# Patient Record
Sex: Male | Born: 1960 | Race: Black or African American | Hispanic: No | Smoking: Current every day smoker
Health system: Southern US, Community
[De-identification: ages and names within clinical notes are randomized; demographics above are authoritative.]

## PROBLEM LIST (undated history)

## (undated) DIAGNOSIS — E119 Type 2 diabetes mellitus without complications: Secondary | ICD-10-CM

## (undated) DIAGNOSIS — F1010 Alcohol abuse, uncomplicated: Secondary | ICD-10-CM

## (undated) HISTORY — PX: OTHER SURGICAL HISTORY: SHX169

---

## 2017-10-27 ENCOUNTER — Encounter: Payer: Self-pay | Admitting: Pediatric Intensive Care

## 2017-11-03 ENCOUNTER — Encounter: Payer: Self-pay | Admitting: Pediatric Intensive Care

## 2017-11-03 DIAGNOSIS — E118 Type 2 diabetes mellitus with unspecified complications: Secondary | ICD-10-CM

## 2017-11-03 LAB — GLUCOSE, POCT (MANUAL RESULT ENTRY): POC Glucose: 230 mg/dl — AB (ref 70–99)

## 2017-11-25 ENCOUNTER — Encounter (HOSPITAL_COMMUNITY): Payer: Self-pay | Admitting: Emergency Medicine

## 2017-11-25 ENCOUNTER — Other Ambulatory Visit: Payer: Self-pay

## 2017-11-25 ENCOUNTER — Emergency Department (HOSPITAL_COMMUNITY)
Admission: EM | Admit: 2017-11-25 | Discharge: 2017-11-26 | Disposition: A | Discharge status: Home / Self Care | Payer: Medicaid Other | Attending: Emergency Medicine | Admitting: Emergency Medicine

## 2017-11-25 DIAGNOSIS — R4182 Altered mental status, unspecified: Secondary | ICD-10-CM | POA: Diagnosis present

## 2017-11-25 DIAGNOSIS — F10920 Alcohol use, unspecified with intoxication, uncomplicated: Secondary | ICD-10-CM

## 2017-11-25 DIAGNOSIS — F10120 Alcohol abuse with intoxication, uncomplicated: Secondary | ICD-10-CM | POA: Insufficient documentation

## 2017-11-25 NOTE — ED Provider Notes (Signed)
[facility]-EMERGENCY DEPT Provider Note   CSN: [id number] Arrival date & time: 11/25/17  2236     History   Chief Complaint Chief Complaint  Patient presents with  . Alcohol Intoxication    HPI Michael [name] is a 57 y.o. male.  LEVEL 5 CAVEAT 2/2 AMS: INTOXICATED  57 year old male presents to the emergency department by EMS after they were called by a bystander who found the patient slumped on the street.  Patient obviously intoxicated.  He was combative on scene with EMS requiring 2.5 mg IV Versed.  Patient allegedly tried to jump out of the EMS vehicle and was given a subsequent dose of 2.5 mg Versed.  He arrived to the emergency department asleep.  No obvious injuries noted.    Alcohol Intoxication     History reviewed. No pertinent past medical history.  There are no active problems to display for this patient.   History reviewed. No pertinent surgical history.     Home Medications    Prior to Admission medications   Not on File    Family History History reviewed. No pertinent family history.  Social History Social History   Tobacco Use  . Smoking status: Not on file  Substance Use Topics  . Alcohol use: Yes  . Drug use: Not on file     Allergies   Patient has no allergy information on record.   Review of Systems Review of Systems  Unable to perform ROS: Mental status change  Patient intoxicated   Physical Exam Updated Vital Signs BP 132/73   Pulse 97   Temp 97.8 F (36.6 C) (Axillary)   Resp 20   SpO2 95%   Physical Exam  Constitutional: He appears well-developed and well-nourished. No distress.  Sleeping, snoring  HENT:  Head: Normocephalic and atraumatic.  Eyes: Conjunctivae and EOM are normal. No scleral icterus.  Neck: Normal range of motion.  Cardiovascular: Normal rate, regular rhythm and intact distal pulses.  Pulmonary/Chest: Effort normal. No stridor. No respiratory distress. He has no wheezes. He  has no rales.  Lungs CTAB  Musculoskeletal: Normal range of motion.  Neurological: He exhibits normal muscle tone.  Skin: Skin is warm and dry. No rash noted. He is not diaphoretic. No erythema. No pallor.  Psychiatric: He has a normal mood and affect. His behavior is normal.  Nursing note and vitals reviewed.    ED Treatments / Results  Labs (all labs ordered are listed, but only abnormal results are displayed) Labs Reviewed - No data to display  EKG  EKG Interpretation None       Radiology No results found.  Procedures Procedures (including critical care time)  Medications Ordered in ED Medications - No data to display    Initial Impression / Assessment and Plan / ED Course  I have reviewed the triage vital signs and the nursing notes.  Pertinent labs & imaging results that were available during my care of the patient were reviewed by me and considered in my medical decision making (see chart for details).     11:10 PM Patient presenting to the emergency department by EMS after being found sleeping on the street by bystanders, intoxicated.  Allegedly agitated with EMS requiring Versed 2.5mg  x 2 for sedation.  Arrives to the ED sleeping, no signs of injury.  2:25 AM Patient up, alert, eating peanuts. Speech mildly slurred. States he came from a shelter.  Reports hx of alcoholism.  Still with evidence of clinical intoxication.  Will allow for further sobering.  2:56 AM Ambulatory independently with steady gait. Wanting to smoke. Directed back to room.   5:49 AM The patient has been allowed to sober further. VSS. He is stable for discharge at this time.   Vitals:   11/26/17 0100 11/26/17 0130 11/26/17 0536 11/26/17 0537  BP: 131/79 118/70  110/70  Pulse: 91 90 88 99  Resp:   18 18  Temp:      TempSrc:      SpO2: 96% 97%  97%    Final Clinical Impressions(s) / ED Diagnoses   Final diagnoses:  Alcoholic intoxication without complication [facility])    ED  Discharge Orders    None       Antony Madura, PA-C 11/26/17 0550    Gerhard Munch, MD 11/26/17 [phone]

## 2017-11-25 NOTE — ED Triage Notes (Signed)
[place] for [place].  A bystander found patient slumped over and called out EMS.   Pt appeared heavily etoh intoxicated on scene, and was also very combative---- was given Versed 2.5 mg IV.  Pt attempted to jumped out of EMS' vehicle and so was given another dose of Versed 2.5 mg IV before pt successfully pulled IV out.  Pt arrived to ED asleep.

## 2017-11-25 NOTE — ED Notes (Signed)
Bed: ZO10WA12 Expected date:  Expected time:  Means of arrival:  Comments: [age] m etoh/agitated

## 2017-11-26 ENCOUNTER — Emergency Department (HOSPITAL_COMMUNITY): Payer: Medicaid Other

## 2017-11-26 ENCOUNTER — Encounter (HOSPITAL_COMMUNITY): Payer: Self-pay

## 2017-11-26 ENCOUNTER — Emergency Department (HOSPITAL_COMMUNITY)
Admission: EM | Admit: 2017-11-26 | Discharge: 2017-11-26 | Disposition: A | Discharge status: Home / Self Care | Payer: Medicaid Other | Source: Home / Self Care | Attending: Emergency Medicine | Admitting: Emergency Medicine

## 2017-11-26 ENCOUNTER — Other Ambulatory Visit: Payer: Self-pay

## 2017-11-26 DIAGNOSIS — F10920 Alcohol use, unspecified with intoxication, uncomplicated: Secondary | ICD-10-CM

## 2017-11-26 LAB — BASIC METABOLIC PANEL
ANION GAP: 14 (ref 5–15)
BUN: 17 mg/dL (ref 6–20)
CHLORIDE: 111 mmol/L (ref 101–111)
CO2: 23 mmol/L (ref 22–32)
Calcium: 9.5 mg/dL (ref 8.9–10.3)
Creatinine, Ser: 1.07 mg/dL (ref 0.61–1.24)
GFR calc Af Amer: >60 mL/min (ref >60)
GLUCOSE: 183 mg/dL — AB (ref 65–99)
Potassium: 4 mmol/L (ref 3.5–5.1)
Sodium: 148 mmol/L — ABNORMAL HIGH (ref 135–145)

## 2017-11-26 LAB — CBC
HEMATOCRIT: 41.8 % (ref 39.0–52.0)
HEMOGLOBIN: 15 g/dL (ref 13.0–17.0)
MCH: 32.6 pg (ref 26.0–34.0)
MCHC: 35.9 g/dL (ref 30.0–36.0)
MCV: 90.9 fL (ref 78.0–100.0)
Platelets: 388 10*3/uL (ref 150–400)
RBC: 4.6 MIL/uL (ref 4.22–5.81)
RDW: 15.1 % (ref 11.5–15.5)
WBC: 5.5 10*3/uL (ref 4.0–10.5)

## 2017-11-26 LAB — ETHANOL: Alcohol, Ethyl (B): 348 mg/dL (ref <10)

## 2017-11-26 LAB — CBG MONITORING, ED: Glucose-Capillary: 223 mg/dL — ABNORMAL HIGH (ref 65–99)

## 2017-11-26 MED ORDER — PROMETHAZINE HCL 25 MG/ML IJ SOLN: 25.0000 mg | Freq: Once | INTRAMUSCULAR | Status: DC

## 2017-11-26 MED ORDER — VITAMIN B-1 100 MG PO TABS: 100.0000 mg | ORAL_TABLET | Freq: Every day | ORAL | 0 refills | Status: DC

## 2017-11-26 MED ORDER — OLANZAPINE 5 MG PO TBDP
5.0000 mg | ORAL_TABLET | Freq: Every day | ORAL | Status: DC
  Administered 2017-11-26: 5 mg via ORAL
  Filled 2017-11-26: qty 1

## 2017-11-26 NOTE — ED Notes (Signed)
Unable to perform EKG, pt. Won't stay in bed, been walking around and even to hallways. Stated that he wanted to go outside to smoke, and promised that he will be back . This Nurse educated pt. That he cant smoke and just come back per policy. Refused to listen and follow. Food and drinks offered.

## 2017-11-26 NOTE — ED Notes (Signed)
Bed: ZO10WA23 Expected date:  Expected time:  Means of arrival:  Comments: 57 yo M/ Depression-ETOH

## 2017-11-26 NOTE — ED Triage Notes (Signed)
Pt BIB [facility]. He was [place]. A bystander called EMS. He appears very intoxicated. He is cursing and not cooperative. He is stating that he needs to go home and needs help, but will not elaborate.

## 2017-11-26 NOTE — ED Notes (Signed)
Patient transported to CT 

## 2017-11-26 NOTE — ED Notes (Signed)
Pt required assistance to bathroom, still somewhat unsteady, Pt provided food tray. Will reassess in hour

## 2017-11-26 NOTE — ED Provider Notes (Signed)
[facility]   CSN: [id number] Arrival date & time: 11/26/17  2043     History   Chief Complaint Chief Complaint  Patient presents with  . Alcohol Intoxication    HPI Michael [name] is a 57 y.o. male.  HPI   57 yo M with PMHx chronic alcoholism here with EtOH intoxication. Pt reportedly was found  Intoxicated by a bystander, [place]. He was markedly intoxication. He has been yelling and cursing at EMS. He states he "needs help" btu will not elaborate. Pt was just seen  Yesterday for similar sx.  Level 5 caveat invoked as remainder of history, ROS, and physical exam limited due to patient's intoxication.   History reviewed. No pertinent past medical history.  There are no active problems to display for this patient.   History reviewed. No pertinent surgical history.     Home Medications    Prior to Admission medications   Medication Sig Start Date End Date Taking? Authorizing Provider  thiamine (VITAMIN B-1) 100 MG tablet Take 1 tablet (100 mg total) by mouth daily. 11/26/17   Shaune Pollack, MD    Family History History reviewed. No pertinent family history.  Social History Social History   Tobacco Use  . Smoking status: Not on file  Substance Use Topics  . Alcohol use: Yes  . Drug use: Not on file     Allergies   Patient has no known allergies.   Review of Systems Review of Systems  Unable to perform ROS: Mental status change     Physical Exam Updated Vital Signs BP (!) 157/118 (BP Location: Right Arm) Comment: refuses to stay still  Pulse 86   Resp 20 Comment: screaming  SpO2 100%   Physical Exam  Constitutional: He appears well-developed and well-nourished. No distress.  Disheveled, smells of alcohol  HENT:  Head: Normocephalic and atraumatic.  No apparent head trauma, no bruising or deformity  Eyes: Conjunctivae are normal.  Neck: Neck supple.  Cardiovascular: Normal rate, regular rhythm  and normal heart sounds. Exam reveals no friction rub.  No murmur heard. Pulmonary/Chest: Effort normal and breath sounds normal. No respiratory distress. He has no wheezes. He has no rales.  Abdominal: He exhibits no distension.  Musculoskeletal: He exhibits no edema.  Skin: Skin is warm. Capillary refill takes less than 2 seconds.  Psychiatric: He has a normal mood and affect.  Nursing note and vitals reviewed.    ED Treatments / Results  Labs (all labs ordered are listed, but only abnormal results are displayed) Labs Reviewed  BASIC METABOLIC PANEL - Abnormal; Notable for the following components:      Result Value   Sodium 148 (*)    Glucose, Bld 183 (*)    All other components within normal limits  CBC  ETHANOL    EKG  EKG Interpretation None       Radiology No results found.  Procedures Procedures (including critical care time)  Medications Ordered in ED Medications  OLANZapine zydis (ZYPREXA) disintegrating tablet 5 mg (5 mg Oral Given 11/26/17 2144)     Initial Impression / Assessment and Plan / ED Course  I have reviewed the triage vital signs and the nursing notes.  Pertinent labs & imaging results that were available during my care of the patient were reviewed by me and considered in my medical decision making (see chart for details).     57 yo M here with EtOH intoxication, found outside. Given his initial  intoxication, CT head obtained and is neg for trauma. No focal neuro deficits or signs of stroke. Lab work reassuring. Pt required Zyprexa on arrival which has calmed him down, and he is now awake, alert, ambulating throughout ED without difficulty, requesting a cigarette and food. He was given food, tolerated well. Clinically, he is sober. Will d/c home. He admits he drank "too much" today. Denies any SI, HI, or AVH.  Final Clinical Impressions(s) / ED Diagnoses   Final diagnoses:  Alcoholic intoxication without complication [facility](HCC)    ED Discharge  Orders        Ordered    thiamine (VITAMIN B-1) 100 MG tablet  Daily     11/26/17 2238       Shaune PollackIsaacs, Dabria Wadas, MD 11/26/17 2249

## 2017-11-26 NOTE — Progress Notes (Signed)
Pt returned from CT around 1030, RN notified of odd, seizure-like activity of patient during scan. Patient grimaced in pain and then became lethargic for 90 seconds. Claimed this has happened before and "he thinks he is just dying". Very dizzy again upon standing from CT table but able to ambulate back to room by himself.

## 2017-11-26 NOTE — ED Notes (Signed)
Pt fully awake at this time---- observed getting out of room and walking back and forth in the hall.   Pt's gait and balance steady.

## 2017-11-26 NOTE — ED Notes (Signed)
Attempted to DC Pt very lethargic and unsteady on his feet, Pt required assistance to get up and back in bed after almost falling. Will reassess in hour Pt ability to ambulate independently without injury.

## 2017-11-26 NOTE — ED Notes (Signed)
Pt screaming that he is hungry. He has been made aware that he cannot have anything to eat until he has been evaluated.

## 2017-12-08 ENCOUNTER — Emergency Department (HOSPITAL_COMMUNITY)
Admission: EM | Admit: 2017-12-08 | Discharge: 2017-12-09 | Disposition: A | Discharge status: Home / Self Care | Payer: Medicaid Other | Attending: Emergency Medicine | Admitting: Emergency Medicine

## 2017-12-08 ENCOUNTER — Emergency Department (HOSPITAL_COMMUNITY)
Admission: EM | Admit: 2017-12-08 | Discharge: 2017-12-08 | Discharge status: Against Medical Advice | Payer: Medicaid Other | Source: Home / Self Care

## 2017-12-08 ENCOUNTER — Encounter (HOSPITAL_COMMUNITY): Payer: Self-pay

## 2017-12-08 ENCOUNTER — Other Ambulatory Visit: Payer: Self-pay

## 2017-12-08 ENCOUNTER — Encounter (HOSPITAL_COMMUNITY): Payer: Self-pay | Admitting: Emergency Medicine

## 2017-12-08 DIAGNOSIS — F10920 Alcohol use, unspecified with intoxication, uncomplicated: Secondary | ICD-10-CM | POA: Insufficient documentation

## 2017-12-08 DIAGNOSIS — F10129 Alcohol abuse with intoxication, unspecified: Secondary | ICD-10-CM | POA: Diagnosis present

## 2017-12-08 DIAGNOSIS — R4781 Slurred speech: Secondary | ICD-10-CM | POA: Insufficient documentation

## 2017-12-08 LAB — COMPREHENSIVE METABOLIC PANEL
ALBUMIN: 3.9 g/dL (ref 3.5–5.0)
ALK PHOS: 87 U/L (ref 38–126)
ALT: 15 U/L — ABNORMAL LOW (ref 17–63)
ANION GAP: 15 (ref 5–15)
AST: 21 U/L (ref 15–41)
BUN: 19 mg/dL (ref 6–20)
CALCIUM: 9.1 mg/dL (ref 8.9–10.3)
CHLORIDE: 107 mmol/L (ref 101–111)
CO2: 21 mmol/L — AB (ref 22–32)
Creatinine, Ser: 1.21 mg/dL (ref 0.61–1.24)
GFR calc non Af Amer: >60 mL/min (ref >60)
GLUCOSE: 228 mg/dL — AB (ref 65–99)
POTASSIUM: 3.8 mmol/L (ref 3.5–5.1)
SODIUM: 143 mmol/L (ref 135–145)
Total Bilirubin: 0.4 mg/dL (ref 0.3–1.2)
Total Protein: 8 g/dL (ref 6.5–8.1)

## 2017-12-08 LAB — CBC WITH DIFFERENTIAL/PLATELET
Basophils Absolute: 0 10*3/uL (ref 0.0–0.1)
Basophils Relative: 1 %
EOS ABS: 0.1 10*3/uL (ref 0.0–0.7)
EOS PCT: 3 %
HCT: 37.5 % — ABNORMAL LOW (ref 39.0–52.0)
HEMOGLOBIN: 13.3 g/dL (ref 13.0–17.0)
Lymphocytes Relative: 64 %
Lymphs Abs: 2.7 10*3/uL (ref 0.7–4.0)
MCH: 31.9 pg (ref 26.0–34.0)
MCHC: 35.5 g/dL (ref 30.0–36.0)
MCV: 89.9 fL (ref 78.0–100.0)
MONOS PCT: 9 %
Monocytes Absolute: 0.4 10*3/uL (ref 0.1–1.0)
NEUTROS PCT: 23 %
Neutro Abs: 1 10*3/uL — ABNORMAL LOW (ref 1.7–7.7)
PLATELETS: 210 10*3/uL (ref 150–400)
RBC: 4.17 MIL/uL — ABNORMAL LOW (ref 4.22–5.81)
RDW: 15.6 % — ABNORMAL HIGH (ref 11.5–15.5)
WBC: 4.2 10*3/uL (ref 4.0–10.5)

## 2017-12-08 LAB — ETHANOL: Alcohol, Ethyl (B): 499 mg/dL (ref <10)

## 2017-12-08 NOTE — ED Notes (Signed)
Patient not in hallway and not in waiting room. [facility] has not seen patient. Notified charge nurse and instructed to remove patient from Epic.

## 2017-12-08 NOTE — ED Provider Notes (Signed)
Care assumed from  Michael MaskerKaren Sofia, PA-C at shift change with labs pending.   In brief, this patient is Michael [name] 57 y.o. male who was found by EMS on the side of the road.  Patient appears intoxicated.  [place] [facility] [place] [facility].  Please see note from previous provider for full history and physical.  PLAN: Patient with labs pending.  Alcohol level is 499.  Plan to let patient sober up here in the ED.  MDM:  Repeat ethanol level is 391.  I will plan to continue to monitor patient until he is clinically sober.  Patient repeatedly becoming agitated in the ED.  He is more alert than previous.  We will plan to give Michael [name] small dose of Ativan to help with agitation.  Patient signed out to Michael HartKelly Gekas, PA-C with plans to ambulate patient.  If patient is clinically sober, he can be reasonably discharged home.  Please see her note for further ED course.   1. Alcoholic intoxication without complication [facility](HCC)       Michael Michael [name], Michael A, PA-C 12/09/17 0737    Michael Michael [name], April, MD 12/09/17 [phone]

## 2017-12-08 NOTE — ED Triage Notes (Signed)
Pt BIB [facility]. Was combative with EMS. [employer] with patient. He is screaming, cursing and berating staff. Pt is cuffed to the bed. He admits to "a lot" of ETOH. Denies drug use.

## 2017-12-08 NOTE — ED Triage Notes (Signed)
[place] for intoxicated person. Pt cursing, ambulatory with assistance.

## 2017-12-08 NOTE — ED Notes (Signed)
Pt screaming and declining triage and vitals.

## 2017-12-08 NOTE — ED Provider Notes (Signed)
  [facility] Provider Note   CSN: [id number] Arrival date & time: 12/08/17  2059     History   Chief Complaint No chief complaint on file.   HPI Michael [name] is a 57 y.o. male.  The history is provided by the patient. No language interpreter was used.  Alcohol Intoxication  This is a new problem. The current episode started 6 to 12 hours ago. The problem has been gradually worsening.  Pt was lying on a sidewalk and a bystander called EMS.  Pt combative with EMS and is now handcuffed by police.   No past medical history on file.  There are no active problems to display for this patient.   No past surgical history on file.     Home Medications    Prior to Admission medications   Medication Sig Start Date End Date Taking? Authorizing Provider  thiamine (VITAMIN B-1) 100 MG tablet Take 1 tablet (100 mg total) by mouth daily. 11/26/17   Shaune PollackIsaacs, Cameron, MD    Family History No family history on file.  Social History Social History   Tobacco Use  . Smoking status: Unknown If Ever Smoked  Substance Use Topics  . Alcohol use: Yes  . Drug use: Not on file     Allergies   Patient has no known allergies.   Review of Systems Review of Systems  All other systems reviewed and are negative.    Physical Exam Updated Vital Signs There were no vitals taken for this visit.  Physical Exam  Constitutional: He appears well-developed and well-nourished.  HENT:  Head: Normocephalic and atraumatic.  Eyes: Conjunctivae and EOM are normal.  Neck: Normal range of motion. Neck supple.  Cardiovascular: Normal rate and regular rhythm.  No murmur heard. Pulmonary/Chest: Effort normal and breath sounds normal. No respiratory distress.  Abdominal: Soft. He exhibits no distension. There is no tenderness.  Musculoskeletal: Normal range of motion. He exhibits no edema.  Neurological: He is alert.  Pt sleeping.  Pt arouses to voice and curses at  police  Skin: Skin is warm and dry.  Psychiatric: He has a normal mood and affect.  Nursing note and vitals reviewed.    ED Treatments / Results  Labs (all labs ordered are listed, but only abnormal results are displayed) Labs Reviewed - No data to display  EKG  EKG Interpretation None       Radiology No results found.  Procedures Procedures (including critical care time)  Medications Ordered in ED Medications - No data to display   Initial Impression / Assessment and Plan / ED Course  I have reviewed the triage vital signs and the nursing notes.  Pertinent labs & imaging results that were available during my care of the patient were reviewed by me and considered in my medical decision making (see chart for details).     MDM  Pt very intoxicated,  Pt yells and then falls asleep.    Final Clinical Impressions(s) / ED Diagnoses   Final diagnoses:  Alcoholic intoxication without complication [facility](HCC)    ED Discharge Orders    None    Pt's care turned over to Graciella FreerLindsey [facility]den [facility] labs pending.  Pt to be observed until sober.    Elson AreasSofia, Leslie K, Cordelia Poche-C 12/08/17 2220    Vanetta MuldersZackowski, Scott, MD 12/09/17 0020

## 2017-12-09 ENCOUNTER — Encounter (HOSPITAL_COMMUNITY): Payer: Self-pay

## 2017-12-09 ENCOUNTER — Emergency Department (HOSPITAL_COMMUNITY)
Admission: EM | Admit: 2017-12-09 | Discharge: 2017-12-10 | Disposition: A | Discharge status: Home / Self Care | Payer: Medicaid Other | Source: Home / Self Care | Attending: Emergency Medicine | Admitting: Emergency Medicine

## 2017-12-09 ENCOUNTER — Other Ambulatory Visit: Payer: Self-pay

## 2017-12-09 DIAGNOSIS — F10920 Alcohol use, unspecified with intoxication, uncomplicated: Secondary | ICD-10-CM

## 2017-12-09 LAB — RAPID URINE DRUG SCREEN, HOSP PERFORMED
AMPHETAMINES: NOT DETECTED
Barbiturates: NOT DETECTED
Benzodiazepines: NOT DETECTED
Cocaine: NOT DETECTED
Opiates: NOT DETECTED
TETRAHYDROCANNABINOL: NOT DETECTED

## 2017-12-09 LAB — ETHANOL: ALCOHOL ETHYL (B): 391 mg/dL — AB (ref <10)

## 2017-12-09 MED ORDER — LORAZEPAM 2 MG/ML IJ SOLN
1.0000 mg | Freq: Once | INTRAMUSCULAR | Status: AC
  Administered 2017-12-09: 1 mg via INTRAMUSCULAR
  Filled 2017-12-09: qty 1

## 2017-12-09 MED ORDER — SODIUM CHLORIDE 0.9 % IV BOLUS (SEPSIS): 1000.0000 mL | Freq: Once | INTRAVENOUS | Status: DC

## 2017-12-09 NOTE — ED Triage Notes (Signed)
EMS reports homeless, [place] [employer] [place], ETOH abuse. DCd this afternoon from Rm 25.   BP 186/84 HR 96 Resp 20 Sp02  CBG 218

## 2017-12-09 NOTE — ED Notes (Signed)
Bed: WHALB Expected date:  Expected time:  Means of arrival:  Comments: 

## 2017-12-09 NOTE — ED Provider Notes (Signed)
57 year old male presents found down by police, acutely intoxicated. Has been seen multiple times in the past several weeks for the same. Well known to ED and typically goes to [facility]. ETOH is 499. Second at 4AM was 391. Will wait till clinically sober and d/c  1:27 PM Nursing state patient is awake walking, talking, eating. Will d/c    Bethel BornGekas, Michael Desena Marie, PA-C 12/09/17 1327    Phineas RealMabe, Latanya MaudlinMartha L, MD 12/09/17 1328

## 2017-12-09 NOTE — ED Notes (Signed)
Pt agitated, trying to bite restraints off wrist, spitting in floor, cursing at staff, security called to assist with keeping pt in bed and re applying restraints.

## 2017-12-09 NOTE — ED Notes (Signed)
Patient sleeping with O2 sats WDL

## 2017-12-09 NOTE — ED Notes (Signed)
Pt ambulating with steady gait and was given a bus pass and water.

## 2017-12-09 NOTE — ED Notes (Signed)
Patient took off restraints with teeth. With security assistance on standby, it was ex[plained to the patient that he needed to stay for his own safety and if he agreed to stay and be nicer to staff that the restraints would be left off. Patient agreed and was given a sandwich and water. Patient was also given a breakfast tray, but was sleeping by the time he received it.

## 2017-12-09 NOTE — ED Provider Notes (Signed)
[facility] Provider Note   CSN: [id number] Arrival date & time: 12/09/17  1738     History   Chief Complaint Chief Complaint  Patient presents with  . Alcohol Intoxication    HPI Michael [name] is a [age] y.o. male with past medical history of homelessness and alcohol abuse, [facility].  Patient was just discharged from this ED around 1 PM today for alcohol intoxication.  Patient reports via [employer] for being [place] park.  Patient states upon discharge he began drinking alcohol again.  Denies injuries or other complaints.  States he stays at a local shelter down the street.  The history is provided by the patient.    History reviewed. No pertinent past medical history.  There are no active problems to display for this patient.   History reviewed. No pertinent surgical history.     Home Medications    Prior to Admission medications   Medication Sig Start Date End Date Taking? Authorizing Provider  thiamine (VITAMIN B-1) 100 MG tablet Take 1 tablet (100 mg total) by mouth daily. Patient not taking: Reported on 12/09/2017 11/26/17   Shaune PollackIsaacs, Cameron, MD    Family History No family history on file.  Social History Social History   Tobacco Use  . Smoking status: Unknown If Ever Smoked  Substance Use Topics  . Alcohol use: Yes  . Drug use: No     Allergies   Lisinopril   Review of Systems Review of Systems  Skin: Negative for wound.  Neurological: Negative for headaches.  Psychiatric/Behavioral: The patient is not nervous/anxious.   All other systems reviewed and are negative.    Physical Exam Updated Vital Signs BP (!) 186/84   Pulse 96   Resp 20   SpO2 96%   Physical Exam  Constitutional: He appears well-developed and well-nourished. No distress.  Patient resting comfortably, eating a food tray.  Speech is slightly slurred, however patient is calm and cooperative  HENT:   Head: Normocephalic and atraumatic.  Eyes: Conjunctivae and EOM are normal. Pupils are equal, round, and reactive to light.  Cardiovascular: Normal rate, regular rhythm and intact distal pulses.  Pulmonary/Chest: Effort normal and breath sounds normal. No respiratory distress.  Abdominal: Soft.  Neurological: He is alert.  Moderately unsteady gait when ambulating to restroom  Skin: Skin is warm.  Psychiatric: He has a normal mood and affect. His behavior is normal.  Nursing note and vitals reviewed.    ED Treatments / Results  Labs (all labs ordered are listed, but only abnormal results are displayed) Labs Reviewed - No data to display  EKG  EKG Interpretation None       Radiology No results found.  Procedures Procedures (including critical care time)  Medications Ordered in ED Medications - No data to display   Initial Impression / Assessment and Plan / ED Course  I have reviewed the triage vital signs and the nursing notes.  Pertinent labs & imaging results that were available during my care of the patient were reviewed by me and considered in my medical decision making (see chart for details).     Patient presenting to the ED after being discharged just hours prior, for alcohol intoxication.  On exam, patient is resting comfortably in bed, eating a food tray.  Speech is slightly slurred.  Patient denies injury or complaints.  He stays at a local shelter down the street intends to go there  following discharge.  Patient monitored, and ambulated in the ED, however with unsteady gait on re-evaluation. Care assumed by Ferdinand Cava, pending re-evaluation. Plan to ambulate patient for steady gait with anticipated discharge.  Final Clinical Impressions(s) / ED Diagnoses   Final diagnoses:  None    ED Discharge Orders    None       Merdith Boyd, [place] N, PA-C 12/09/17 2156    Nira Conn, MD 12/10/17 [phone]

## 2017-12-09 NOTE — ED Notes (Signed)
Pt provided food tray

## 2017-12-10 ENCOUNTER — Emergency Department (HOSPITAL_COMMUNITY)
Admission: EM | Admit: 2017-12-10 | Discharge: 2017-12-11 | Disposition: A | Discharge status: Home / Self Care | Payer: Medicaid Other | Attending: Emergency Medicine | Admitting: Emergency Medicine

## 2017-12-10 ENCOUNTER — Encounter (HOSPITAL_COMMUNITY): Payer: Self-pay

## 2017-12-10 ENCOUNTER — Emergency Department (HOSPITAL_COMMUNITY): Payer: Medicaid Other

## 2017-12-10 DIAGNOSIS — M25562 Pain in left knee: Secondary | ICD-10-CM | POA: Diagnosis not present

## 2017-12-10 DIAGNOSIS — G8929 Other chronic pain: Secondary | ICD-10-CM | POA: Insufficient documentation

## 2017-12-10 DIAGNOSIS — F10920 Alcohol use, unspecified with intoxication, uncomplicated: Secondary | ICD-10-CM | POA: Diagnosis present

## 2017-12-10 NOTE — ED Provider Notes (Signed)
Care assumed from  [place]il[place] Robinson, PA-C at shift change with plans to ambulate patient.   In brief, this patient is a 57 y.o. male who presents to the ED for evaluation of alcohol intoxication.  Patient was intoxicated on initial ED arrival.  Plan is to have patient sober up in the ED, reevaluate patient and have him ambulate in the department.  See from previous provider for full history and physical.  PLAN: Once patient becomes sober and can ambulate without any difficulty, plan for discharge home.  MDM:  Patient was able to ambulate but reports some left knee pain and requested an x-ray of knee.  Evaluation of the knee shows no overlying soft tissue swelling, warmth, erythema.  Exam is not concerning for septic arthritis.  We will plan to get the x-ray.  Knee x-ray reviewed.  Negative for any acute fracture dislocation.  I discussed results with patient.  He was sleeping comfortably on the bed but was easily arousable.  Patient was alert and was talking with clear and coherent speech.  He was able to ambulate without any signs of ataxia.  Patient is clinically sober and stable for discharge at this time.  Vital signs reviewed.  He is slightly hypertensive.  Review of his previous vital signs show that he has been hypertensive throughout his entire ED stay including several days ago.  Patient instructed to follow-up with [facility] for any further evaluation. Patient had ample opportunity for questions and discussion. All patient's questions were answered with full understanding. Strict return precautions discussed. Patient expresses understanding and agreement to plan.    1. Alcoholic intoxication without complication [facility](HCC)       Maxwell CaulLayden, Anayeli Arel A, PA-C 12/10/17 [id number]    Geoffery Lyonselo, Douglas, MD 12/10/17 [phone]

## 2017-12-10 NOTE — ED Notes (Signed)
Pt ambulated well without assistance or assistive device----- gait and balance steady although with a limp on left leg which he stated is chronic.

## 2017-12-10 NOTE — ED Notes (Signed)
Bed: WHALD Expected date:  Expected time:  Means of arrival:  Comments: 

## 2017-12-10 NOTE — ED Provider Notes (Signed)
[facility]-EMERGENCY DEPT Provider Note   CSN: [id number] Arrival date & time: 12/10/17  4098     History   Chief Complaint No chief complaint on file.   HPI Michael [name] is a 57 y.o. male with subsequent visit to the ED for alcohol intoxication.  Patient was evaluated yesterday in the ED for similar complaint.  Bystander called EMS which brought him to this ED alcohol intoxication.  On evaluation, patient endorses drinking vodka today.  States his left knee has been bothering him.  He states this is a chronic pain, with no new injuries.  Has been taking 400 mg of ibuprofen for pain.  X-ray was performed yesterday in the ED, negative for acute pathology.  No other complaints.  The history is provided by the patient.    History reviewed. No pertinent past medical history.  There are no active problems to display for this patient.   History reviewed. No pertinent surgical history.     Home Medications    Prior to Admission medications   Medication Sig Start Date End Date Taking? Authorizing Provider  thiamine (VITAMIN B-1) 100 MG tablet Take 1 tablet (100 mg total) by mouth daily. Patient not taking: Reported on 12/09/2017 11/26/17   Shaune Pollack, MD    Family History History reviewed. No pertinent family history.  Social History Social History   Tobacco Use  . Smoking status: Unknown If Ever Smoked  Substance Use Topics  . Alcohol use: Yes  . Drug use: No     Allergies   Lisinopril   Review of Systems Review of Systems  Musculoskeletal: Positive for arthralgias.  All other systems reviewed and are negative.    Physical Exam Updated Vital Signs BP (!) 160/112 (BP Location: Right Arm)   Pulse (!) 107   Temp 98.3 F (36.8 C) (Oral)   Resp 18   SpO2 100%   Physical Exam  Constitutional: He appears well-developed and well-nourished. No distress.  Very mild slurred speech. Poor hygiene. Not in distress.   HENT:  Head:  Normocephalic and atraumatic.  Eyes: Conjunctivae are normal.  Cardiovascular: Regular rhythm, normal heart sounds and intact distal pulses.  Mildly tachycardic  Pulmonary/Chest: Effort normal and breath sounds normal.  Abdominal: Soft.  Musculoskeletal:  Left knee without tenderness, edema or deformity. Nl ROM  Neurological: He is alert.  Gait mildly unsteady with limp favoring left.   Skin: Skin is warm.  Psychiatric: He has a normal mood and affect. His behavior is normal.  Nursing note and vitals reviewed.    ED Treatments / Results  Labs (all labs ordered are listed, but only abnormal results are displayed) Labs Reviewed - No data to display  EKG  EKG Interpretation None       Radiology Dg Knee Complete 4 Views Left  Result Date: 12/10/2017 CLINICAL DATA:  Left knee pain. EXAM: LEFT KNEE - COMPLETE 4+ VIEW COMPARISON:  None. FINDINGS: No evidence of fracture, dislocation, or joint effusion. Mild medial tibiofemoral joint space narrowing. Alignment is otherwise maintained. Soft tissues are unremarkable. IMPRESSION: 1. No acute abnormality. 2. Mild medial tibiofemoral joint space narrowing, typically degenerative. Electronically Signed   By: Rubye Oaks M.D.   On: 12/10/2017 01:37    Procedures Procedures (including critical care time)  Medications Ordered in ED Medications - No data to display   Initial Impression / Assessment and Plan / ED Course  I have reviewed the triage vital signs and the nursing notes.  Pertinent labs &  imaging results that were available during my care of the patient were reviewed by me and considered in my medical decision making (see chart for details).    Patient presenting to the ED via EMS for alcohol intoxication.  On exam, patient is alert, speech is mildly slurred.  Gait is slightly unsteady with limp favoring left.  Reporting chronic left knee pain, with negative x-ray done last night in the ED.  No new injury.  Patient without  other complaints and reassuring exam.  Patient given sandwich and p.o. hydration. Pt currently sleeping. Care assumed by Sharilyn SitesLisa Sanders, PA-C, at shift change. Plan to reevaluate and ambulate with anticipated discharge.  Patient given PCP referral when discharged last night.  Encourage follow-up.  Final Clinical Impressions(s) / ED Diagnoses   Final diagnoses:  Alcoholic intoxication without complication [facility](HCC)    ED Discharge Orders    None       Bibiana Gillean, [place]Jordan N, PA-C 12/10/17 2316    Charlynne PanderYao, David Hsienta, MD 12/10/17 [phone]

## 2017-12-10 NOTE — ED Triage Notes (Signed)
Patient BIB EMS for ETOH abuse. Bystander called EMS. Patient was seen here yesterday.

## 2017-12-10 NOTE — Discharge Instructions (Signed)
Follow-up with [facility].  Return emergency department for any worsening pain, fever, chest pain, difficulty breathing or any other worsening or concerning symptoms.

## 2017-12-11 ENCOUNTER — Other Ambulatory Visit: Payer: Self-pay

## 2017-12-11 ENCOUNTER — Encounter (HOSPITAL_COMMUNITY): Payer: Self-pay | Admitting: Emergency Medicine

## 2017-12-11 ENCOUNTER — Emergency Department (HOSPITAL_COMMUNITY)
Admission: EM | Admit: 2017-12-11 | Discharge: 2017-12-11 | Disposition: A | Discharge status: Home / Self Care | Payer: Medicaid Other | Source: Home / Self Care | Attending: Emergency Medicine | Admitting: Emergency Medicine

## 2017-12-11 DIAGNOSIS — M25562 Pain in left knee: Secondary | ICD-10-CM

## 2017-12-11 DIAGNOSIS — G8929 Other chronic pain: Secondary | ICD-10-CM

## 2017-12-11 NOTE — ED Provider Notes (Signed)
[facility]-EMERGENCY DEPT Provider Note   CSN: [id number] Arrival date & time: 12/11/17  0335     History   Chief Complaint Chief Complaint  Patient presents with  . Leg Pain    HPI Michael [name] is a 57 y.o. male.  The history is provided by the patient and medical records.  Leg Pain      57 year old male here with left knee pain.  He has been in the ED for several hours last evening and early this morning due to acute alcohol intoxication.  He has a long-standing history of chronic left knee pain.  He had an x-ray performed yesterday that was normal.  He has not had any new falls or trauma.  He was in the waiting room awaiting public bus transport and decided to check back again.  He is currently sleeping in the exam chair but is arousable to loud voice.  No past medical history on file.  There are no active problems to display for this patient.   No past surgical history on file.     Home Medications    Prior to Admission medications   Medication Sig Start Date End Date Taking? Authorizing Provider  thiamine (VITAMIN B-1) 100 MG tablet Take 1 tablet (100 mg total) by mouth daily. Patient not taking: Reported on 12/09/2017 11/26/17   Shaune PollackIsaacs, Cameron, MD    Family History No family history on file.  Social History Social History   Tobacco Use  . Smoking status: Unknown If Ever Smoked  Substance Use Topics  . Alcohol use: Yes  . Drug use: No     Allergies   Lisinopril   Review of Systems Review of Systems  Musculoskeletal: Positive for arthralgias.  All other systems reviewed and are negative.    Physical Exam Updated Vital Signs BP (!) 145/67 (BP Location: Left Arm)   Pulse (!) 107   Temp 98 F (36.7 C) (Oral)   Resp 15   Ht 6' (1.829 m)   Wt 72.6 kg (160 lb)   SpO2 94%   BMI 21.70 kg/m   Physical Exam  Constitutional: He is oriented to person, place, and time. He appears well-developed and well-nourished.  Sleeping  in exam chair, arousable to loud voice  HENT:  Head: Normocephalic and atraumatic.  Mouth/Throat: Oropharynx is clear and moist.  Eyes: Conjunctivae and EOM are normal. Pupils are equal, round, and reactive to light.  Neck: Normal range of motion.  Cardiovascular: Normal rate, regular rhythm and normal heart sounds.  Pulmonary/Chest: Effort normal and breath sounds normal. No stridor. No respiratory distress.  Abdominal: Soft. Bowel sounds are normal. There is no tenderness. There is no rebound.  Musculoskeletal: Normal range of motion.  No acute deformities of left knee or leg, ambulatory  Neurological: He is alert and oriented to person, place, and time.  Skin: Skin is warm and dry.  Psychiatric: He has a normal mood and affect.  Nursing note and vitals reviewed.    ED Treatments / Results  Labs (all labs ordered are listed, but only abnormal results are displayed) Labs Reviewed - No data to display  EKG  EKG Interpretation None       Radiology Dg Knee Complete 4 Views Left  Result Date: 12/10/2017 CLINICAL DATA:  Left knee pain. EXAM: LEFT KNEE - COMPLETE 4+ VIEW COMPARISON:  None. FINDINGS: No evidence of fracture, dislocation, or joint effusion. Mild medial tibiofemoral joint space narrowing. Alignment is otherwise maintained. Soft tissues are  unremarkable. IMPRESSION: 1. No acute abnormality. 2. Mild medial tibiofemoral joint space narrowing, typically degenerative. Electronically Signed   By: Rubye Oaks M.D.   On: 12/10/2017 01:37    Procedures Procedures (including critical care time)  Medications Ordered in ED Medications - No data to display   Initial Impression / Assessment and Plan / ED Course  I have reviewed the triage vital signs and the nursing notes.  Pertinent labs & imaging results that were available during my care of the patient were reviewed by me and considered in my medical decision making (see chart for details).  57 y.o. M here with left  knee pain.  This appears to be a chronic issue. Patient was here for same on 3/16, and last evening into early this morning.  He was discharged to waiting room to wait for public transport, but decided to check back in.  He is sleeping soundly in room, NAD.  He does arouse to loud voice.  No acute deformities of knee.  X-ray from yesterday morning reviewed, no acute findings.  Patient can be safely discharged.    Final Clinical Impressions(s) / ED Diagnoses   Final diagnoses:  Acute pain of left knee    ED Discharge Orders    None       Garlon Hatchet, PA-C 12/11/17 1610    Zadie Rhine, MD 12/11/17 [phone]

## 2017-12-11 NOTE — ED Notes (Signed)
Patient is resting comfortably. 

## 2017-12-11 NOTE — Discharge Instructions (Signed)
Stop drinking

## 2017-12-11 NOTE — ED Notes (Signed)
Bed: WTR6 Expected date:  Expected time:  Means of arrival:  Comments: 

## 2017-12-11 NOTE — ED Triage Notes (Signed)
Patient was just released. Patient is complaining of left leg pain.

## 2017-12-11 NOTE — ED Provider Notes (Signed)
Assumed care from PA HattonRobinson at shift change.  See prior notes for full H&P.  Briefly, 57 y.o. [name] here acutely intoxicated.  Seen here for same yesterday.  Complained of left knee pain which is chronic, he had x-ray yesterday which was normal.  Plan:  Sober and likely d/c home.  2:45 AM Patient has ambulated to and from restroom here with steady gait.  Can be d/c home.  He was escorted to waiting room to wait for buses.   Garlon HatchetSanders, Michael Golinski M, PA-C 12/11/17 [id number]    Zadie RhineWickline, Donald, MD 12/11/17 [phone]

## 2017-12-13 ENCOUNTER — Other Ambulatory Visit: Payer: Self-pay

## 2017-12-13 ENCOUNTER — Emergency Department (HOSPITAL_COMMUNITY)
Admission: EM | Admit: 2017-12-13 | Discharge: 2017-12-13 | Disposition: A | Discharge status: Home / Self Care | Payer: Medicaid Other | Attending: Emergency Medicine | Admitting: Emergency Medicine

## 2017-12-13 ENCOUNTER — Encounter (HOSPITAL_COMMUNITY): Payer: Self-pay | Admitting: *Deleted

## 2017-12-13 DIAGNOSIS — F10920 Alcohol use, unspecified with intoxication, uncomplicated: Secondary | ICD-10-CM | POA: Diagnosis present

## 2017-12-13 DIAGNOSIS — Z590 Homelessness: Secondary | ICD-10-CM | POA: Insufficient documentation

## 2017-12-13 HISTORY — DX: Alcohol abuse, uncomplicated: F10.10

## 2017-12-13 MED ORDER — OLANZAPINE 5 MG PO TABS
5.0000 mg | ORAL_TABLET | Freq: Once | ORAL | Status: AC
  Administered 2017-12-13: 5 mg via ORAL
  Filled 2017-12-13: qty 1

## 2017-12-13 NOTE — ED Notes (Signed)
Pt finally resting with eyes closed, resp even and non labored. Pt was provided with sandwich and soda prior to napping.

## 2017-12-13 NOTE — ED Provider Notes (Signed)
Patient is alert and in no distress.  His speech is clear and oriented.  States he is ready to go out and have a cigarette now.  Reports his legs are messed up and is always unsteady on his feet, he reports that not going to change.  Patient is now sitting up in the stretcher moving about in coordinated fashion with clear mentation.  Plan for discharge.   Arby BarrettePfeiffer, Deola Rewis, MD 12/13/17 2044

## 2017-12-13 NOTE — ED Provider Notes (Signed)
[facility]-EMER[facility] DEPT Provider Note   CSN: [id number] Arrival date & time: 12/13/17  1239     History   Chief Complaint Chief Complaint  Patient presents with  . Alcohol Intoxication    HPI Michael [name] is a 57 y.o. male.  Chief complaint is "I am really drunk".  HPI 57 year old male.  Alcoholic.  Homeless.  Was sleeping on the sidewalk today.  Brought in by paramedics for evaluation.  He claims no injuries.  He is crying and states "I drink because of my father".  Past Medical History:  Diagnosis Date  . ETOH abuse     There are no active problems to display for this patient.   History reviewed. No pertinent surgical history.     Home Medications    Prior to Admission medications   Medication Sig Start Date End Date Taking? Authorizing Provider  thiamine (VITAMIN B-1) 100 MG tablet Take 1 tablet (100 mg total) by mouth daily. Patient not taking: Reported on 12/09/2017 11/26/17   Shaune PollackIsaacs, Cameron, MD    Family History No family history on file.  Social History Social History   Tobacco Use  . Smoking status: Unknown If Ever Smoked  . Smokeless tobacco: Never Used  Substance Use Topics  . Alcohol use: Yes  . Drug use: No     Allergies   Lisinopril   Review of Systems Review of Systems  Unable to perform ROS: Patient nonverbal  Level 5 caveat for intoxication  Physical Exam Updated Vital Signs BP (!) 182/111 (BP Location: Right Arm)   Pulse 89   Resp 17   Ht 6' (1.829 m)   Wt 72.6 kg (160 lb)   SpO2 100%   BMI 21.70 kg/m   Physical Exam  Constitutional: He appears well-developed and well-nourished. No distress.  HENT:  Head: Normocephalic.  Eyes: Conjunctivae are normal. Pupils are equal, round, and reactive to light. No scleral icterus.  Neck: Normal range of motion. Neck supple. No thyromegaly present.  Cardiovascular: Normal rate and regular rhythm. Exam reveals no gallop and no friction rub.  No murmur  heard. Pulmonary/Chest: Effort normal and breath sounds normal. No respiratory distress. He has no wheezes. He has no rales.  Abdominal: Soft. Bowel sounds are normal. He exhibits no distension. There is no tenderness. There is no rebound.  Musculoskeletal: Normal range of motion.  Neurological: He is alert.  Skin: Skin is warm and dry. No rash noted.     ED Treatments / Results  Labs (all labs ordered are listed, but only abnormal results are displayed) Labs Reviewed - No data to display  EKG  EKG Interpretation None       Radiology No results found.  Procedures Procedures (including critical care time)  Medications Ordered in ED Medications  OLANZapine (ZYPREXA) tablet 5 mg (5 mg Oral Given 12/13/17 1256)     Initial Impression / Assessment and Plan / ED Course  I have reviewed the triage vital signs and the nursing notes.  Pertinent labs & imaging results that were available during my care of the patient were reviewed by me and considered in my medical decision making (see chart for details).    And unsteady on his feet.  Given Zyprexa.  Is sleeping.  Will have to metabolize to a point of being stable on his feet and safe for .  Medical screening exam reveals no acute abnormalities.  Final Clinical Impressions(s) / ED Diagnoses   Final diagnoses:  Alcoholic intoxication  without complication [facility])    ED Discharge Orders    None       Rolland Porter, MD 12/13/17 1537
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
## 2017-12-13 NOTE — ED Notes (Signed)
Patient ambulatory by self with steady gait in hallway

## 2017-12-19 ENCOUNTER — Encounter: Payer: Self-pay | Admitting: Pediatric Intensive Care

## 2017-12-19 NOTE — Congregational Nurse Program (Signed)
Congregational Nurse Program Note  Date of Encounter: 11/03/2017  Past Medical History: Past Medical History:  Diagnosis Date  . ETOH abuse     Encounter Details: CN assisted client in setting up medication. Client states he will take Lantus as directed. He states he understands the importance of blood sugar control. Client states he feels that stress of living at shelter will cause him to start drinking again. [facility]. States he would like to meet with Yetta Glassman [facility] BH nurse on Monday afternoon.

## 2017-12-19 NOTE — Congregational Nurse Program (Signed)
Congregational Nurse Program Note  Date of Encounter: 10/27/2017  Past Medical History: Past Medical History:  Diagnosis Date  . ETOH abuse     Encounter Details: Client is new to shelter. [place]. He has prescriptions from rehab stay, including vitamins. Client declines BG check today. He states he takes metformin, glucotrol and lantus at night. He states he checks his own blood glucose levels. [facility] with client Medicaid number. CN will arrange delivery of medication. CN advise client of delivery window for medication. Follow up in clinic as needed.

## 2017-12-22 ENCOUNTER — Emergency Department (HOSPITAL_COMMUNITY): Payer: Medicaid Other

## 2017-12-22 ENCOUNTER — Encounter (HOSPITAL_COMMUNITY): Payer: Self-pay | Admitting: *Deleted

## 2017-12-22 ENCOUNTER — Emergency Department (HOSPITAL_COMMUNITY)
Admission: EM | Admit: 2017-12-22 | Discharge: 2017-12-22 | Disposition: A | Discharge status: Home / Self Care | Payer: Medicaid Other | Attending: Emergency Medicine | Admitting: Emergency Medicine

## 2017-12-22 DIAGNOSIS — G8929 Other chronic pain: Secondary | ICD-10-CM | POA: Insufficient documentation

## 2017-12-22 DIAGNOSIS — G8921 Chronic pain due to trauma: Secondary | ICD-10-CM

## 2017-12-22 DIAGNOSIS — M79605 Pain in left leg: Secondary | ICD-10-CM | POA: Insufficient documentation

## 2017-12-22 DIAGNOSIS — Z590 Homelessness: Secondary | ICD-10-CM | POA: Diagnosis not present

## 2017-12-22 MED ORDER — KETOROLAC TROMETHAMINE 60 MG/2ML IM SOLN
30.0000 mg | Freq: Once | INTRAMUSCULAR | Status: AC
  Administered 2017-12-22: 30 mg via INTRAMUSCULAR
  Filled 2017-12-22: qty 2

## 2017-12-22 MED ORDER — IBUPROFEN 800 MG PO TABS: 800.0000 mg | ORAL_TABLET | Freq: Three times a day (TID) | ORAL | 0 refills | Status: DC

## 2017-12-22 MED ORDER — HYDROCODONE-ACETAMINOPHEN 5-325 MG PO TABS: ORAL_TABLET | ORAL | 0 refills | Status: DC

## 2017-12-22 MED FILL — IBUPROFEN 800 MG TAB: 800 | 20 days supply | Qty: 60 | Fill #0

## 2017-12-22 MED FILL — HYDROCODON-APAP 5-325: 5-325 | 1 days supply | Qty: 7 | Fill #0
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
## 2017-12-22 NOTE — ED Notes (Signed)
Bed: WTR9 Expected date:  Expected time:  Means of arrival:  Comments: 

## 2017-12-22 NOTE — ED Triage Notes (Signed)
Per EMS, pt complains of left leg for the past 3 days since a fall. Pt states pain has gotten worse. Pain is worse around his left knee and left ankle.

## 2017-12-22 NOTE — Discharge Instructions (Addendum)
You have had  your shot today, do not take any more ibuprofen today.  You can start taking the ibuprofen tomorrow.  He can take Vicodin for breakthrough pain.  Do not hesitate to return to the emergency room for any new, worsening or concerning symptoms.  Please obtain primary care using resource guide below. Let them know that you were seen in the emergency room and that they will need to obtain records for further outpatient management.
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
## 2018-01-01 NOTE — Congregational Nurse Program (Signed)
Congregational Nurse Program Note  Date of Encounter: 12/19/2017  Past Medical History: Past Medical History:  Diagnosis Date  . ETOH abuse     Encounter Details: CNP Questionnaire - 12/20/17 1353      Questionnaire   Patient Status  Not Applicable    Race  Black or African American    Location Patient Served At  Not Applicable    Insurance  Medicaid;Medicare    Uninsured  Not Applicable    Food  Yes, have food insecurities;Within past 12 months, worried food would run out with no money to buy more    Housing/Utilities  No permanent housing    Transportation  No transportation needs    Interpersonal Safety  No, do not feel physically and emotionally safe where you currently live    Medication  No medication insecurities    Medical Provider  No    Referrals  Primary Care Provider/Clinic;Area Agency    ED Visit Averted  Not Applicable    Life-Saving Intervention Made  Not Applicable      Client in clinic requesting inpatient alcohol abuse program. States his last drink "was about a week ago and I've already gone through withdrawal". Client is familiar to this CN. CN contacted C Evans CN, behavioral health nurse for program to speak with client regarding inpatient placement. Client will meet with her tomorrow.

## 2018-01-13 ENCOUNTER — Emergency Department (HOSPITAL_COMMUNITY): Payer: Medicaid Other

## 2018-01-13 ENCOUNTER — Encounter (HOSPITAL_COMMUNITY): Payer: Self-pay | Admitting: Emergency Medicine

## 2018-01-13 ENCOUNTER — Inpatient Hospital Stay (HOSPITAL_COMMUNITY): Payer: Medicaid Other

## 2018-01-13 ENCOUNTER — Inpatient Hospital Stay (HOSPITAL_COMMUNITY)
Admission: EM | Admit: 2018-01-13 | Discharge: 2018-01-22 | DRG: 208 | Disposition: A | Discharge status: Home / Self Care | Payer: Medicaid Other | Attending: Internal Medicine | Admitting: Internal Medicine

## 2018-01-13 DIAGNOSIS — J690 Pneumonitis due to inhalation of food and vomit: Secondary | ICD-10-CM | POA: Diagnosis present

## 2018-01-13 DIAGNOSIS — Z6826 Body mass index (BMI) 26.0-26.9, adult: Secondary | ICD-10-CM | POA: Diagnosis not present

## 2018-01-13 DIAGNOSIS — Z598 Other problems related to housing and economic circumstances: Secondary | ICD-10-CM

## 2018-01-13 DIAGNOSIS — F10221 Alcohol dependence with intoxication delirium: Secondary | ICD-10-CM | POA: Diagnosis present

## 2018-01-13 DIAGNOSIS — J9602 Acute respiratory failure with hypercapnia: Secondary | ICD-10-CM | POA: Diagnosis present

## 2018-01-13 DIAGNOSIS — E876 Hypokalemia: Secondary | ICD-10-CM | POA: Diagnosis present

## 2018-01-13 DIAGNOSIS — D649 Anemia, unspecified: Secondary | ICD-10-CM | POA: Diagnosis present

## 2018-01-13 DIAGNOSIS — G9340 Encephalopathy, unspecified: Secondary | ICD-10-CM

## 2018-01-13 DIAGNOSIS — E441 Mild protein-calorie malnutrition: Secondary | ICD-10-CM | POA: Diagnosis present

## 2018-01-13 DIAGNOSIS — Y907 Blood alcohol level of 200-239 mg/100 ml: Secondary | ICD-10-CM | POA: Diagnosis present

## 2018-01-13 DIAGNOSIS — I10 Essential (primary) hypertension: Secondary | ICD-10-CM | POA: Diagnosis present

## 2018-01-13 DIAGNOSIS — Z888 Allergy status to other drugs, medicaments and biological substances status: Secondary | ICD-10-CM

## 2018-01-13 DIAGNOSIS — G312 Degeneration of nervous system due to alcohol: Secondary | ICD-10-CM | POA: Diagnosis present

## 2018-01-13 DIAGNOSIS — F17210 Nicotine dependence, cigarettes, uncomplicated: Secondary | ICD-10-CM | POA: Diagnosis present

## 2018-01-13 DIAGNOSIS — F1020 Alcohol dependence, uncomplicated: Secondary | ICD-10-CM | POA: Diagnosis not present

## 2018-01-13 DIAGNOSIS — Z781 Physical restraint status: Secondary | ICD-10-CM

## 2018-01-13 DIAGNOSIS — K0889 Other specified disorders of teeth and supporting structures: Secondary | ICD-10-CM | POA: Diagnosis present

## 2018-01-13 DIAGNOSIS — J9601 Acute respiratory failure with hypoxia: Principal | ICD-10-CM | POA: Diagnosis present

## 2018-01-13 DIAGNOSIS — Z794 Long term (current) use of insulin: Secondary | ICD-10-CM

## 2018-01-13 DIAGNOSIS — R569 Unspecified convulsions: Secondary | ICD-10-CM | POA: Diagnosis not present

## 2018-01-13 DIAGNOSIS — H6002 Abscess of left external ear: Secondary | ICD-10-CM | POA: Diagnosis present

## 2018-01-13 DIAGNOSIS — E1165 Type 2 diabetes mellitus with hyperglycemia: Secondary | ICD-10-CM | POA: Diagnosis present

## 2018-01-13 DIAGNOSIS — H60392 Other infective otitis externa, left ear: Secondary | ICD-10-CM | POA: Diagnosis not present

## 2018-01-13 DIAGNOSIS — F10231 Alcohol dependence with withdrawal delirium: Secondary | ICD-10-CM

## 2018-01-13 DIAGNOSIS — F10931 Alcohol use, unspecified with withdrawal delirium: Secondary | ICD-10-CM

## 2018-01-13 DIAGNOSIS — J9600 Acute respiratory failure, unspecified whether with hypoxia or hypercapnia: Secondary | ICD-10-CM

## 2018-01-13 DIAGNOSIS — F10239 Alcohol dependence with withdrawal, unspecified: Secondary | ICD-10-CM | POA: Diagnosis not present

## 2018-01-13 DIAGNOSIS — G4700 Insomnia, unspecified: Secondary | ICD-10-CM | POA: Diagnosis present

## 2018-01-13 DIAGNOSIS — F329 Major depressive disorder, single episode, unspecified: Secondary | ICD-10-CM | POA: Diagnosis present

## 2018-01-13 DIAGNOSIS — R000 Tachycardia, unspecified: Secondary | ICD-10-CM | POA: Diagnosis present

## 2018-01-13 DIAGNOSIS — L8990 Pressure ulcer of unspecified site, unspecified stage: Secondary | ICD-10-CM | POA: Diagnosis present

## 2018-01-13 DIAGNOSIS — Z590 Homelessness: Secondary | ICD-10-CM

## 2018-01-13 HISTORY — DX: Type 2 diabetes mellitus without complications: E11.9

## 2018-01-13 LAB — CBC WITH DIFFERENTIAL/PLATELET
BASOS ABS: 0 10*3/uL (ref 0.0–0.1)
BASOS ABS: 0 10*3/uL (ref 0.0–0.1)
BASOS PCT: 0 %
BASOS PCT: 0 %
Basophils Absolute: 0 10*3/uL (ref 0.0–0.1)
Basophils Relative: 0 %
EOS PCT: 1 %
EOS PCT: 0 %
EOS PCT: 0 %
Eosinophils Absolute: 0.1 10*3/uL (ref 0.0–0.7)
Eosinophils Absolute: 0 10*3/uL (ref 0.0–0.7)
Eosinophils Absolute: 0 10*3/uL (ref 0.0–0.7)
HCT: 36.7 % — ABNORMAL LOW (ref 39.0–52.0)
HEMATOCRIT: 31.5 % — AB (ref 39.0–52.0)
HEMATOCRIT: 33.8 % — AB (ref 39.0–52.0)
Hemoglobin: 13 g/dL (ref 13.0–17.0)
Hemoglobin: 11.1 g/dL — ABNORMAL LOW (ref 13.0–17.0)
Hemoglobin: 11.7 g/dL — ABNORMAL LOW (ref 13.0–17.0)
LYMPHS ABS: 1.9 10*3/uL (ref 0.7–4.0)
LYMPHS PCT: 30 %
LYMPHS PCT: 13 %
LYMPHS PCT: 17 %
Lymphs Abs: 1.9 10*3/uL (ref 0.7–4.0)
Lymphs Abs: 1.1 10*3/uL (ref 0.7–4.0)
MCH: 31.9 pg (ref 26.0–34.0)
MCH: 31.3 pg (ref 26.0–34.0)
MCH: 31.4 pg (ref 26.0–34.0)
MCHC: 35.4 g/dL (ref 30.0–36.0)
MCHC: 35.2 g/dL (ref 30.0–36.0)
MCHC: 34.6 g/dL (ref 30.0–36.0)
MCV: 90 fL (ref 78.0–100.0)
MCV: 88.7 fL (ref 78.0–100.0)
MCV: 90.6 fL (ref 78.0–100.0)
MONO ABS: 0.4 10*3/uL (ref 0.1–1.0)
MONO ABS: 0.6 10*3/uL (ref 0.1–1.0)
Monocytes Absolute: 0.2 10*3/uL (ref 0.1–1.0)
Monocytes Relative: 4 %
Monocytes Relative: 4 %
Monocytes Relative: 6 %
NEUTROS ABS: 6.7 10*3/uL (ref 1.7–7.7)
NEUTROS ABS: 8.5 10*3/uL — AB (ref 1.7–7.7)
Neutro Abs: 4 10*3/uL (ref 1.7–7.7)
Neutrophils Relative %: 65 %
Neutrophils Relative %: 83 %
Neutrophils Relative %: 77 %
PLATELETS: 261 10*3/uL (ref 150–400)
PLATELETS: 177 10*3/uL (ref 150–400)
PLATELETS: 220 10*3/uL (ref 150–400)
RBC: 4.08 MIL/uL — AB (ref 4.22–5.81)
RBC: 3.55 MIL/uL — ABNORMAL LOW (ref 4.22–5.81)
RBC: 3.73 MIL/uL — ABNORMAL LOW (ref 4.22–5.81)
RDW: 16.1 % — ABNORMAL HIGH (ref 11.5–15.5)
RDW: 16 % — AB (ref 11.5–15.5)
RDW: 16.3 % — AB (ref 11.5–15.5)
WBC: 6.2 10*3/uL (ref 4.0–10.5)
WBC: 8.1 10*3/uL (ref 4.0–10.5)
WBC: 11.1 10*3/uL — ABNORMAL HIGH (ref 4.0–10.5)

## 2018-01-13 LAB — BLOOD GAS, ARTERIAL
Acid-base deficit: 9.4 mmol/L — ABNORMAL HIGH (ref 0.0–2.0)
BICARBONATE: 18.8 mmol/L — AB (ref 20.0–28.0)
Drawn by: 51425
FIO2: 60
LHR: 15 {breaths}/min
MECHVT: 620 mL
O2 Saturation: 93.1 %
PEEP/CPAP: 5 cmH2O
Patient temperature: 100.8
pCO2 arterial: 55.3 mmHg — ABNORMAL HIGH (ref 32.0–48.0)
pH, Arterial: 7.166 — CL (ref 7.350–7.450)
pO2, Arterial: 95.5 mmHg (ref 83.0–108.0)

## 2018-01-13 LAB — COMPREHENSIVE METABOLIC PANEL
ALBUMIN: 3.9 g/dL (ref 3.5–5.0)
ALBUMIN: 3.7 g/dL (ref 3.5–5.0)
ALK PHOS: 91 U/L (ref 38–126)
ALT: 12 U/L — ABNORMAL LOW (ref 17–63)
ALT: 12 U/L — AB (ref 17–63)
ALT: 14 U/L — AB (ref 17–63)
ANION GAP: 16 — AB (ref 5–15)
ANION GAP: 11 (ref 5–15)
AST: 23 U/L (ref 15–41)
AST: 31 U/L (ref 15–41)
AST: 25 U/L (ref 15–41)
Albumin: 3.4 g/dL — ABNORMAL LOW (ref 3.5–5.0)
Alkaline Phosphatase: 105 U/L (ref 38–126)
Alkaline Phosphatase: 92 U/L (ref 38–126)
Anion gap: 10 (ref 5–15)
BILIRUBIN TOTAL: 0.6 mg/dL (ref 0.3–1.2)
BILIRUBIN TOTAL: 0.8 mg/dL (ref 0.3–1.2)
BUN: 19 mg/dL (ref 6–20)
BUN: 17 mg/dL (ref 6–20)
BUN: 13 mg/dL (ref 6–20)
CALCIUM: 9 mg/dL (ref 8.9–10.3)
CHLORIDE: 113 mmol/L — AB (ref 101–111)
CHLORIDE: 119 mmol/L — AB (ref 101–111)
CO2: 20 mmol/L — AB (ref 22–32)
CO2: 22 mmol/L (ref 22–32)
CO2: 19 mmol/L — ABNORMAL LOW (ref 22–32)
CREATININE: 0.72 mg/dL (ref 0.61–1.24)
CREATININE: 0.68 mg/dL (ref 0.61–1.24)
Calcium: 9.3 mg/dL (ref 8.9–10.3)
Calcium: 7.8 mg/dL — ABNORMAL LOW (ref 8.9–10.3)
Chloride: 115 mmol/L — ABNORMAL HIGH (ref 101–111)
Creatinine, Ser: 0.84 mg/dL (ref 0.61–1.24)
GFR calc Af Amer: >60 mL/min (ref >60)
GFR calc Af Amer: >60 mL/min (ref >60)
GFR calc non Af Amer: >60 mL/min (ref >60)
GFR calc non Af Amer: >60 mL/min (ref >60)
GFR calc non Af Amer: >60 mL/min (ref >60)
GLUCOSE: 420 mg/dL — AB (ref 65–99)
GLUCOSE: 208 mg/dL — AB (ref 65–99)
GLUCOSE: 134 mg/dL — AB (ref 65–99)
POTASSIUM: 3.5 mmol/L (ref 3.5–5.1)
Potassium: 4.5 mmol/L (ref 3.5–5.1)
Potassium: 4.7 mmol/L (ref 3.5–5.1)
SODIUM: 149 mmol/L — AB (ref 135–145)
SODIUM: 148 mmol/L — AB (ref 135–145)
Sodium: 148 mmol/L — ABNORMAL HIGH (ref 135–145)
TOTAL PROTEIN: 7.7 g/dL (ref 6.5–8.1)
Total Bilirubin: 0.2 mg/dL — ABNORMAL LOW (ref 0.3–1.2)
Total Protein: 8.4 g/dL — ABNORMAL HIGH (ref 6.5–8.1)
Total Protein: 7.3 g/dL (ref 6.5–8.1)

## 2018-01-13 LAB — URINALYSIS, ROUTINE W REFLEX MICROSCOPIC
BILIRUBIN URINE: NEGATIVE
GLUCOSE, UA: 150 mg/dL — AB
KETONES UR: NEGATIVE mg/dL
LEUKOCYTES UA: NEGATIVE
Nitrite: NEGATIVE
PH: 5 (ref 5.0–8.0)
PROTEIN: NEGATIVE mg/dL
SQUAMOUS EPITHELIAL / LPF: NONE SEEN
Specific Gravity, Urine: 1.014 (ref 1.005–1.030)

## 2018-01-13 LAB — TROPONIN I
TROPONIN I: 0.06 ng/mL — AB (ref <0.03)
Troponin I: 0.06 ng/mL (ref <0.03)

## 2018-01-13 LAB — APTT: aPTT: 27 seconds (ref 24–36)

## 2018-01-13 LAB — PROTIME-INR
INR: 1
Prothrombin Time: 13.1 seconds (ref 11.4–15.2)

## 2018-01-13 LAB — I-STAT CHEM 8, ED
BUN: 18 mg/dL (ref 6–20)
CALCIUM ION: 1.08 mmol/L — AB (ref 1.15–1.40)
CREATININE: 1.2 mg/dL (ref 0.61–1.24)
Chloride: 113 mmol/L — ABNORMAL HIGH (ref 101–111)
GLUCOSE: 408 mg/dL — AB (ref 65–99)
HCT: 40 % (ref 39.0–52.0)
HEMOGLOBIN: 13.6 g/dL (ref 13.0–17.0)
Potassium: 4 mmol/L (ref 3.5–5.1)
Sodium: 148 mmol/L — ABNORMAL HIGH (ref 135–145)
TCO2: 22 mmol/L (ref 22–32)

## 2018-01-13 LAB — GLUCOSE, CAPILLARY: Glucose-Capillary: 60 mg/dL — ABNORMAL LOW (ref 65–99)

## 2018-01-13 LAB — RAPID URINE DRUG SCREEN, HOSP PERFORMED
AMPHETAMINES: NOT DETECTED
BARBITURATES: NOT DETECTED
BENZODIAZEPINES: NOT DETECTED
Cocaine: NOT DETECTED
OPIATES: NOT DETECTED
TETRAHYDROCANNABINOL: NOT DETECTED

## 2018-01-13 LAB — BRAIN NATRIURETIC PEPTIDE: B Natriuretic Peptide: 187.7 pg/mL — ABNORMAL HIGH (ref 0.0–100.0)

## 2018-01-13 LAB — ETHANOL
ALCOHOL ETHYL (B): 455 mg/dL — AB (ref <10)
Alcohol, Ethyl (B): 231 mg/dL — ABNORMAL HIGH (ref <10)

## 2018-01-13 LAB — CBG MONITORING, ED
GLUCOSE-CAPILLARY: 214 mg/dL — AB (ref 65–99)
Glucose-Capillary: 287 mg/dL — ABNORMAL HIGH (ref 65–99)
Glucose-Capillary: 189 mg/dL — ABNORMAL HIGH (ref 65–99)

## 2018-01-13 LAB — PROCALCITONIN: Procalcitonin: <0.1 ng/mL

## 2018-01-13 LAB — CK: Total CK: 466 U/L — ABNORMAL HIGH (ref 49–397)

## 2018-01-13 LAB — I-STAT CG4 LACTIC ACID, ED: LACTIC ACID, VENOUS: 2.34 mmol/L — AB (ref 0.5–1.9)

## 2018-01-13 LAB — ACETAMINOPHEN LEVEL: Acetaminophen (Tylenol), Serum: <10 ug/mL — ABNORMAL LOW (ref 10–30)

## 2018-01-13 LAB — LACTIC ACID, PLASMA: LACTIC ACID, VENOUS: 1.5 mmol/L (ref 0.5–1.9)

## 2018-01-13 LAB — SALICYLATE LEVEL: Salicylate Lvl: <7 mg/dL (ref 2.8–30.0)

## 2018-01-13 LAB — MAGNESIUM
MAGNESIUM: 2 mg/dL (ref 1.7–2.4)
Magnesium: 1.1 mg/dL — ABNORMAL LOW (ref 1.7–2.4)

## 2018-01-13 LAB — PHOSPHORUS: Phosphorus: 2 mg/dL — ABNORMAL LOW (ref 2.5–4.6)

## 2018-01-13 MED ORDER — PIPERACILLIN-TAZOBACTAM 3.375 G IVPB
3.3750 g | Freq: Three times a day (TID) | INTRAVENOUS | Status: DC
  Administered 2018-01-14 – 2018-01-16 (×9): 3.375 g via INTRAVENOUS
  Filled 2018-01-13 (×11): qty 50

## 2018-01-13 MED ORDER — DEXMEDETOMIDINE HCL IN NACL 400 MCG/100ML IV SOLN
0.4000 ug/kg/h | INTRAVENOUS | Status: DC
  Administered 2018-01-13: 1 ug/kg/h via INTRAVENOUS
  Administered 2018-01-14: 0.8 ug/kg/h via INTRAVENOUS
  Administered 2018-01-14: 1 ug/kg/h via INTRAVENOUS
  Filled 2018-01-13 (×5): qty 100

## 2018-01-13 MED ORDER — SODIUM CHLORIDE 0.9 % IJ SOLN
INTRAMUSCULAR | Status: AC
  Filled 2018-01-13: qty 50

## 2018-01-13 MED ORDER — ACETAMINOPHEN 650 MG RE SUPP
650.0000 mg | Freq: Four times a day (QID) | RECTAL | Status: DC | PRN
Start: 2018-01-13 — End: 2018-01-22

## 2018-01-13 MED ORDER — HEPARIN SODIUM (PORCINE) 5000 UNIT/ML IJ SOLN
5000.0000 [IU] | Freq: Three times a day (TID) | INTRAMUSCULAR | Status: DC
  Administered 2018-01-14 – 2018-01-21 (×16): 5000 [IU] via SUBCUTANEOUS
  Filled 2018-01-13 (×21): qty 1

## 2018-01-13 MED ORDER — FENTANYL CITRATE (PF) 100 MCG/2ML IJ SOLN
100.0000 ug | INTRAMUSCULAR | Status: DC | PRN
  Administered 2018-01-13 (×2): 100 ug via INTRAVENOUS
  Filled 2018-01-13 (×2): qty 2

## 2018-01-13 MED ORDER — THIAMINE HCL 100 MG/ML IJ SOLN
100.0000 mg | Freq: Every day | INTRAMUSCULAR | Status: DC
  Administered 2018-01-13 – 2018-01-16 (×4): 100 mg via INTRAVENOUS
  Filled 2018-01-13 (×4): qty 2

## 2018-01-13 MED ORDER — NICOTINE 14 MG/24HR TD PT24
14.0000 mg | MEDICATED_PATCH | Freq: Once | TRANSDERMAL | Status: DC
  Administered 2018-01-13: 14 mg via TRANSDERMAL
  Filled 2018-01-13: qty 1

## 2018-01-13 MED ORDER — ETOMIDATE 2 MG/ML IV SOLN
INTRAVENOUS | Status: AC | PRN
  Administered 2018-01-13: 20 mg via INTRAVENOUS

## 2018-01-13 MED ORDER — ROCURONIUM BROMIDE 50 MG/5ML IV SOLN
INTRAVENOUS | Status: AC | PRN
  Administered 2018-01-13: 100 mg via INTRAVENOUS

## 2018-01-13 MED ORDER — SODIUM CHLORIDE 0.9 % IV SOLN
1.0000 g | Freq: Once | INTRAVENOUS | Status: AC
  Administered 2018-01-13: 1 g via INTRAVENOUS
  Filled 2018-01-13: qty 10

## 2018-01-13 MED ORDER — ORAL CARE MOUTH RINSE
15.0000 mL | Freq: Four times a day (QID) | OROMUCOSAL | Status: DC
  Administered 2018-01-14 – 2018-01-15 (×6): 15 mL via OROMUCOSAL

## 2018-01-13 MED ORDER — ONDANSETRON HCL 4 MG/2ML IJ SOLN: 4.0000 mg | Freq: Four times a day (QID) | INTRAMUSCULAR | Status: DC | PRN

## 2018-01-13 MED ORDER — SODIUM CHLORIDE 0.9 % IV BOLUS
1000.0000 mL | Freq: Once | INTRAVENOUS | Status: AC
  Administered 2018-01-13: 1000 mL via INTRAVENOUS

## 2018-01-13 MED ORDER — DEXMEDETOMIDINE BOLUS VIA INFUSION
1.0000 ug/kg | Freq: Once | INTRAVENOUS | Status: AC
  Administered 2018-01-13: 72.6 ug via INTRAVENOUS
  Filled 2018-01-13: qty 73

## 2018-01-13 MED ORDER — FAMOTIDINE IN NACL 20-0.9 MG/50ML-% IV SOLN
20.0000 mg | Freq: Two times a day (BID) | INTRAVENOUS | Status: DC
  Administered 2018-01-14 – 2018-01-16 (×6): 20 mg via INTRAVENOUS
  Filled 2018-01-13 (×6): qty 50

## 2018-01-13 MED ORDER — CHLORHEXIDINE GLUCONATE 0.12% ORAL RINSE (MEDLINE KIT)
15.0000 mL | Freq: Two times a day (BID) | OROMUCOSAL | Status: DC
  Administered 2018-01-14 (×2): 15 mL via OROMUCOSAL

## 2018-01-13 MED ORDER — HALOPERIDOL LACTATE 5 MG/ML IJ SOLN
5.0000 mg | Freq: Once | INTRAMUSCULAR | Status: AC
  Administered 2018-01-13: 5 mg via INTRAVENOUS
  Filled 2018-01-13: qty 1

## 2018-01-13 MED ORDER — FOLIC ACID 5 MG/ML IJ SOLN
1.0000 mg | Freq: Every day | INTRAMUSCULAR | Status: DC
  Administered 2018-01-13 – 2018-01-16 (×4): 1 mg via INTRAVENOUS
  Filled 2018-01-13 (×4): qty 0.2

## 2018-01-13 MED ORDER — LACTATED RINGERS IV SOLN
INTRAVENOUS | Status: DC
  Administered 2018-01-13 – 2018-01-15 (×4): via INTRAVENOUS

## 2018-01-13 MED ORDER — SODIUM CHLORIDE 0.9 % IV SOLN
Freq: Once | INTRAVENOUS | Status: AC
  Administered 2018-01-13: 19:00:00 via INTRAVENOUS

## 2018-01-13 MED ORDER — SODIUM CHLORIDE 0.9 % IV SOLN
500.0000 mg | Freq: Once | INTRAVENOUS | Status: AC
  Administered 2018-01-13: 500 mg via INTRAVENOUS
  Filled 2018-01-13: qty 500

## 2018-01-13 MED ORDER — IOPAMIDOL (ISOVUE-370) INJECTION 76%
INTRAVENOUS | Status: AC
  Administered 2018-01-13: 100 mL via INTRAVENOUS
  Filled 2018-01-13: qty 100

## 2018-01-13 MED ORDER — LORAZEPAM 2 MG/ML IJ SOLN
1.0000 mg | Freq: Once | INTRAMUSCULAR | Status: AC
  Administered 2018-01-13: 1 mg via INTRAVENOUS
  Filled 2018-01-13: qty 1

## 2018-01-13 MED ORDER — DEXTROSE 50 % IV SOLN
25.0000 mL | Freq: Once | INTRAVENOUS | Status: AC
  Administered 2018-01-13: 25 mL via INTRAVENOUS

## 2018-01-13 MED ORDER — INSULIN ASPART 100 UNIT/ML ~~LOC~~ SOLN
2.0000 [IU] | SUBCUTANEOUS | Status: DC
  Administered 2018-01-13: 6 [IU] via SUBCUTANEOUS
  Administered 2018-01-14 (×2): 4 [IU] via SUBCUTANEOUS
  Filled 2018-01-13: qty 1

## 2018-01-13 MED ORDER — ALBUTEROL SULFATE (2.5 MG/3ML) 0.083% IN NEBU: 2.5000 mg | INHALATION_SOLUTION | RESPIRATORY_TRACT | Status: DC | PRN

## 2018-01-13 MED ORDER — DEXMEDETOMIDINE HCL IN NACL 200 MCG/50ML IV SOLN
0.4000 ug/kg/h | INTRAVENOUS | Status: DC
  Administered 2018-01-13: 0.4 ug/kg/h via INTRAVENOUS
  Filled 2018-01-13 (×2): qty 50

## 2018-01-13 MED ORDER — DEXMEDETOMIDINE HCL IN NACL 200 MCG/50ML IV SOLN: 0.0000 ug/kg/h | INTRAVENOUS | Status: DC

## 2018-01-13 MED ORDER — DEXMEDETOMIDINE HCL IN NACL 200 MCG/50ML IV SOLN
0.4000 ug/kg/h | INTRAVENOUS | Status: DC
  Filled 2018-01-13: qty 50

## 2018-01-13 MED ORDER — LORAZEPAM 2 MG/ML IJ SOLN
1.0000 mg | INTRAMUSCULAR | Status: DC | PRN
  Administered 2018-01-13 – 2018-01-14 (×3): 2 mg via INTRAVENOUS
  Filled 2018-01-13 (×3): qty 1

## 2018-01-13 MED ORDER — LACTATED RINGERS IV BOLUS
1000.0000 mL | Freq: Once | INTRAVENOUS | Status: AC
Start: 2018-01-13 — End: 2018-01-13
  Administered 2018-01-13: 1000 mL via INTRAVENOUS

## 2018-01-13 MED ORDER — STERILE WATER FOR INJECTION IJ SOLN
INTRAMUSCULAR | Status: AC
  Filled 2018-01-13: qty 10

## 2018-01-13 MED ORDER — SODIUM CHLORIDE 0.9 % IV SOLN
250.0000 mL | INTRAVENOUS | Status: DC | PRN
Start: 2018-01-13 — End: 2018-01-16

## 2018-01-13 MED ORDER — FENTANYL CITRATE (PF) 100 MCG/2ML IJ SOLN
100.0000 ug | INTRAMUSCULAR | Status: DC | PRN
  Administered 2018-01-13 – 2018-01-14 (×3): 100 ug via INTRAVENOUS
  Filled 2018-01-13 (×3): qty 2

## 2018-01-13 MED ORDER — ZIPRASIDONE MESYLATE 20 MG IM SOLR
10.0000 mg | Freq: Once | INTRAMUSCULAR | Status: AC
  Administered 2018-01-13: 10 mg via INTRAMUSCULAR
  Filled 2018-01-13: qty 20

## 2018-01-13 MED ORDER — MIDAZOLAM HCL 2 MG/2ML IJ SOLN
2.0000 mg | Freq: Once | INTRAMUSCULAR | Status: AC
  Administered 2018-01-13: 2 mg via INTRAVENOUS
  Filled 2018-01-13: qty 2

## 2018-01-13 MED ORDER — DOCUSATE SODIUM 50 MG/5ML PO LIQD
100.0000 mg | Freq: Two times a day (BID) | ORAL | Status: DC
  Administered 2018-01-14 – 2018-01-15 (×2): 100 mg
  Filled 2018-01-13 (×5): qty 10

## 2018-01-13 MED ORDER — DOCUSATE SODIUM 50 MG/5ML PO LIQD: 100.0000 mg | Freq: Two times a day (BID) | ORAL | Status: DC | PRN

## 2018-01-13 MED ORDER — ACETAMINOPHEN 160 MG/5ML PO SOLN
650.0000 mg | Freq: Four times a day (QID) | ORAL | Status: DC | PRN
  Administered 2018-01-17 – 2018-01-22 (×4): 650 mg via ORAL
  Filled 2018-01-13 (×4): qty 20.3

## 2018-01-13 NOTE — ED Notes (Signed)
Bed: [id number] Expected date:  Expected time:  Means of arrival:  [facility]

## 2018-01-13 NOTE — ED Notes (Signed)
Pt began having tremors starting in left arm and side, which suddenly became generalized and then suddenly stopped with abnormal facial movements noted. Pt seemed to have been having a seizure as he became unresponsive. Abnormal activity lasted for less than 5 minutes. When pt became responsive, he could not recall what had happened. Security had been at bedside assisting with patient's inappropriate/violent behavior prior to the seizure activity, and assisted nursing staff with pt. Dr. Adriana Simasook made aware. MD came and assessed pt. No new orders given. Pt continued to be monitored and cared for.  Derinda SisVera Calypso Hagarty,rn.

## 2018-01-13 NOTE — H&P (Signed)
PULMONARY / CRITICAL CARE MEDICINE   Name: Michael [name] MRN: [id number] DOB: Nov 11, 1960    ADMISSION DATE:  01/13/2018 CONSULTATION DATE: 01/13/18  REFERRING MD: Dr Silverio Lay (ER)  CHIEF COMPLAINT: Etoh withdrawal  HISTORY OF PRESENT ILLNESS:   56yoM with hx Etoh abuse and DM, found today lying outside on pavement with bottle of liquor in backpack. He was immediately agitated on arousal and was taken to the [facility] ER where he had a witnessed seizure at 11:35am (per RN note, SZ was less than 5 minutes in duration). While in the ER patient received Haldol 5mg  IV (@ 1625), Ativan 2mg  IV (@ 1630 and 1754), Versed 2mg  IV (@0558 ), Geodon 10mg  IM (@1152 ), as well as 2L IVF. Despite this he continued to be agitated, ripping out IV's and Foley catheter. He was then started on a Precedex gtt at 8pm per RN. On my evaluation, patient is snoring and is minimally responsive, grimacing only to sternal rub, not obeying eyes, not obeying commands. Respiratory distress with RR 44 and accessory muscle use, Pox 92% on 4L O2. A sitter is present at the bedside but is unable to give any information on when patient became unresponsive or when he was last agitated.   PAST MEDICAL HISTORY :  He  has a past medical history of Diabetes mellitus without complication (HCC) and ETOH abuse.  PAST SURGICAL HISTORY: He  has no past surgical history on file.  Allergies  Allergen Reactions  . Lisinopril Swelling    Received ACEi with resultant angioedema, pt previously denied but now recalls previous episode with "blood pressure medicine"     No current facility-administered medications on file prior to encounter.    Current Outpatient Medications on File Prior to Encounter  Medication Sig  . insulin glargine (LANTUS) 100 UNIT/ML injection Inject 12 Units into the skin at bedtime.  . metFORMIN (GLUCOPHAGE) 500 MG tablet Take 500 mg by mouth 2 (two) times daily with a meal.  . HYDROcodone-acetaminophen (NORCO/VICODIN) 5-325 MG tablet  Take 1-2 tablets by mouth every 6 hours as needed for pain and/or cough. (Patient not taking: Reported on 01/13/2018)  . ibuprofen (ADVIL,MOTRIN) 800 MG tablet Take 1 tablet (800 mg total) by mouth 3 (three) times daily.  Marland Kitchen thiamine (VITAMIN B-1) 100 MG tablet Take 1 tablet (100 mg total) by mouth daily. (Patient not taking: Reported on 12/09/2017)   FAMILY HISTORY:  His has no family status information on file.    SOCIAL HISTORY: He  has an unknown smoking status. He has never used smokeless tobacco. He reports that he drinks alcohol. He reports that he does not use drugs.  REVIEW OF SYSTEMS:   Review of Systems  Unable to perform ROS: Mental status change   SUBJECTIVE:  Lying on ER stretcher, unresponsive on precedex gtt  VITAL SIGNS: BP (!) 147/79 (BP Location: Left Arm)   Pulse (!) 105   Resp (!) 38   Wt 72.6 kg (160 lb)   SpO2 94%   BMI 21.70 kg/m   INTAKE / OUTPUT: I/O last 3 completed shifts: In: 2000 [IV Piggyback:2000] Out: -   PHYSICAL EXAMINATION: General: Dissheveled Adult male, appears older than stated age, lying on ER stretcher  Neuro: Eyes closed, snoring respirations, grimaces to sternal rub but not waking up. Pupils pinpoint b/l HEENT: OP clear, MM dry Cardiovascular: Tachycardic with a regular rhythm, no m/r/g Lungs: Rhonchi b/l, RR 44 with accessory muscle use present in neck muscles  Abdomen: Soft NTND Musculoskeletal: no LE edema  GU: condom catheter in place  Skin: no rashes   LABS:  BMET Recent Labs  Lab 01/13/18 0535 01/13/18 0547 01/13/18 1616  NA 149* 148* 148*  K 4.5 4.0 4.7  CL 113* 113* 115*  CO2 20*  --  22  BUN 19 18 17   CREATININE 0.84 1.20 0.72  GLUCOSE 420* 408* 208*   Electrolytes Recent Labs  Lab 01/13/18 0535 01/13/18 1616  CALCIUM 9.3 9.0  MG 2.0  --    CBC Recent Labs  Lab 01/13/18 0535 01/13/18 0547 01/13/18 1851  WBC 6.2  --  8.1  HGB 13.0 13.6 11.1*  HCT 36.7* 40.0 31.5*  PLT 261  --  177    Coag's No results for input(s): APTT, INR in the last 168 hours.  Sepsis Markers Recent Labs  Lab 01/13/18 1907  LATICACIDVEN 2.34*   ABG No results for input(s): PHART, PCO2ART, PO2ART in the last 168 hours.  Liver Enzymes Recent Labs  Lab 01/13/18 0535 01/13/18 1616  AST 23 31  ALT 12* 12*  ALKPHOS 105 91  BILITOT 0.6 0.8  ALBUMIN 3.9 3.7   Cardiac Enzymes No results for input(s): TROPONINI, PROBNP in the last 168 hours.  Glucose Recent Labs  Lab 01/13/18 1127 01/13/18 1623 01/13/18 2018  GLUCAP 287* 189* 214*   Imaging Ct Head Wo Contrast  Result Date: 01/13/2018 CLINICAL DATA:  Patient found down. Blood coming out of mouth. Concern for head or cervical spine injury. Hyperglycemia. EXAM: CT HEAD WITHOUT CONTRAST CT CERVICAL SPINE WITHOUT CONTRAST TECHNIQUE: Multidetector CT imaging of the head and cervical spine was performed following the standard protocol without intravenous contrast. Multiplanar CT image reconstructions of the cervical spine were also generated. COMPARISON:  CT of the head performed 11/26/2017 FINDINGS: CT HEAD FINDINGS Brain: No evidence of acute infarction, hemorrhage, hydrocephalus, extra-axial collection or mass lesion/mass effect. Evaluation is mildly suboptimal due to motion artifact. The posterior fossa, including the cerebellum, brainstem and fourth ventricle, is within normal limits. The third and lateral ventricles, and basal ganglia are unremarkable in appearance. The cerebral hemispheres are symmetric in appearance, with normal gray-white differentiation. No mass effect or midline shift is seen. Vascular: No hyperdense vessel or unexpected calcification. Skull: There is no evidence of fracture; visualized osseous structures are unremarkable in appearance. Sinuses/Orbits: The orbits are within normal limits. The paranasal sinuses and mastoid air cells are well-aerated. Other: No significant soft tissue abnormalities are seen. CT CERVICAL  SPINE FINDINGS Alignment: Normal. Evaluation is significantly suboptimal due to motion artifact. Skull base and vertebrae: No acute fracture. No primary bone lesion or focal pathologic process. Soft tissues and spinal canal: No prevertebral fluid or swelling. No visible canal hematoma. Disc levels: Multilevel disc space narrowing is noted along the cervical spine. Degenerative change is noted about the dens. Upper chest: Scattered blebs are noted at the lung apices. The thyroid gland is grossly unremarkable, though not well assessed due to motion artifact. Calcification is noted at the carotid bifurcations bilaterally. Other: No additional soft tissue abnormalities are seen. IMPRESSION: 1. No evidence of traumatic intracranial injury or fracture. 2. No evidence of fracture or subluxation along the cervical spine. 3. Mild degenerative change along the cervical spine. 4. Scattered blebs at the lung apices. 5. Calcification at the carotid bifurcations bilaterally. Carotid ultrasound would be helpful for further evaluation, when and as deemed clinically appropriate. Electronically Signed   By: Roanna Raider M.D.   On: 01/13/2018 06:43   Ct Angio Chest Pe W And/or  Wo Contrast  Result Date: 01/13/2018 CLINICAL DATA:  Pulmonary embolus suspected, high pretest probability EXAM: CT ANGIOGRAPHY CHEST WITH CONTRAST TECHNIQUE: Multidetector CT imaging of the chest was performed using the standard protocol during bolus administration of intravenous contrast. Multiplanar CT image reconstructions and MIPs were obtained to evaluate the vascular anatomy. CONTRAST:  <See Chart> ISOVUE-370 IOPAMIDOL (ISOVUE-370) INJECTION 76% COMPARISON:  Chest x-ray performed today FINDINGS: Cardiovascular: No visible filling defects in the pulmonary arteries to suggest pulmonary emboli. Motion degrades image quality in the lung bases. Heart is borderline in size. Aorta is normal caliber. Mediastinum/Nodes: No mediastinal, hilar, or axillary  adenopathy. Lungs/Pleura: Apical blebs/bullae. Ground-glass airspace opacities noted in both lower lobes as well as in the lingula, most pronounced in the right lower lobe where there is nodular ground-glass airspace disease. Favor early pneumonia. No effusions. Upper Abdomen: Imaging into the upper abdomen shows no acute findings. Musculoskeletal: Chest wall soft tissues are unremarkable. Compression fractures noted in the mid thoracic spine, age indeterminate. Review of the MIP images confirms the above findings. IMPRESSION: No evidence of pulmonary embolus. Motion degrades image quality in the lung bases. Cardiomegaly. Ground-glass airspace disease, most pronounced in the right lower lobe also noted in the left lower lobe and lingula concerning for early multifocal pneumonia. Multiple mild to moderate compression fractures in the midthoracic spine, age indeterminate. Electronically Signed   By: Charlett Nose M.D.   On: 01/13/2018 18:25   Ct Cervical Spine Wo Contrast  Result Date: 01/13/2018 CLINICAL DATA:  Patient found down. Blood coming out of mouth. Concern for head or cervical spine injury. Hyperglycemia. EXAM: CT HEAD WITHOUT CONTRAST CT CERVICAL SPINE WITHOUT CONTRAST TECHNIQUE: Multidetector CT imaging of the head and cervical spine was performed following the standard protocol without intravenous contrast. Multiplanar CT image reconstructions of the cervical spine were also generated. COMPARISON:  CT of the head performed 11/26/2017 FINDINGS: CT HEAD FINDINGS Brain: No evidence of acute infarction, hemorrhage, hydrocephalus, extra-axial collection or mass lesion/mass effect. Evaluation is mildly suboptimal due to motion artifact. The posterior fossa, including the cerebellum, brainstem and fourth ventricle, is within normal limits. The third and lateral ventricles, and basal ganglia are unremarkable in appearance. The cerebral hemispheres are symmetric in appearance, with normal gray-white  differentiation. No mass effect or midline shift is seen. Vascular: No hyperdense vessel or unexpected calcification. Skull: There is no evidence of fracture; visualized osseous structures are unremarkable in appearance. Sinuses/Orbits: The orbits are within normal limits. The paranasal sinuses and mastoid air cells are well-aerated. Other: No significant soft tissue abnormalities are seen. CT CERVICAL SPINE FINDINGS Alignment: Normal. Evaluation is significantly suboptimal due to motion artifact. Skull base and vertebrae: No acute fracture. No primary bone lesion or focal pathologic process. Soft tissues and spinal canal: No prevertebral fluid or swelling. No visible canal hematoma. Disc levels: Multilevel disc space narrowing is noted along the cervical spine. Degenerative change is noted about the dens. Upper chest: Scattered blebs are noted at the lung apices. The thyroid gland is grossly unremarkable, though not well assessed due to motion artifact. Calcification is noted at the carotid bifurcations bilaterally. Other: No additional soft tissue abnormalities are seen. IMPRESSION: 1. No evidence of traumatic intracranial injury or fracture. 2. No evidence of fracture or subluxation along the cervical spine. 3. Mild degenerative change along the cervical spine. 4. Scattered blebs at the lung apices. 5. Calcification at the carotid bifurcations bilaterally. Carotid ultrasound would be helpful for further evaluation, when and as deemed clinically appropriate. Electronically  Signed   By: Roanna RaiderJeffery  Chang M.D.   On: 01/13/2018 06:43   Dg Chest Port 1 View  Result Date: 01/13/2018 CLINICAL DATA:  SOB, pt for med clearance, Pt was found laying on the pavement. [employer] was on scene and found a fifth of vodka in the pt's backpack that was almost gone. Pt is combative and not able to follow commands. EXAM: PORTABLE CHEST 1 VIEW COMPARISON:  None. FINDINGS: The heart size and mediastinal contours are within normal limits.  Both lungs are clear. The visualized skeletal structures are unremarkable. IMPRESSION: No active disease. Electronically Signed   By: Norva PavlovElizabeth  Brown M.D.   On: 01/13/2018 16:46   CULTURES: Blood cultures (4/20): pending Sputum culture (4/20): ordered  ANTIBIOTICS: Ceftriaxone 4/20 Azithromycin 4/20  SIGNIFICANT EVENTS: 4/20: found down on pavement, intoxicated and agitated >> had seizure in ER  LINES/TUBES: PIV Condom catheter   DISCUSSION: 56yoM with hx Etoh abuse and DM, found today lying outside on pavement with bottle of liquor in backpack. He was immediately agitated on arousal and [facility] where he had a witnessed seizure followed by progressive acute hypoxic respiratory failure in setting of aspiration pneumonia, then developed acute encephalopathy and acute hypercapneic respiratory failure following initiation of precedex infusion, now requiring intubation.   ASSESSMENT / PLAN:  PULMONARY 1. Acute hypoxic and hypercapneic respiratory failure; Aspiration pneumonia: - acute hypoxia following the witnessed seizure he had today - CXR shows bibasilar infiltrates consistent with aspiration pneumonia. - now also acutely hypercapneic following initiation of precedex infusion; plan for emergent intubation by ED MD. Repeat ABG post intubation - repeat CXR post intubation. - start Zosyn for aspiration pna. Obtain sputum culture, trend procalcitonin and lactate  CARDIOVASCULAR No active issues   RENAL No active issues   GASTROINTESTINAL No active issues; NPO  HEMATOLOGIC 1. Anemia - Hgb 11 down from baseline of 13-15; no signs of blood loss. Continue to monitor  INFECTIOUS 1. Aspiration Pneumonia - panculture; trend lactate and procalcitonin. Check MRSA PCR nares. Start Zosyn.  ENDOCRINE 1. DM - NPO; cover with SSI   NEUROLOGIC 1. Etoh intoxication; Seizure thought due to Delirium Tremens; Acute encephalopathy - post intubation will resume precedex  gtt; monitor CIWA and given Ativan IV PRN - obtain EEG - Head and Cspine CT showed no acute process.  - start Folate and Thiamine IV daily  FAMILY  - Updates: no family present in ER at time of my exam - Inter-disciplinary family meet or Palliative Care meeting due by:  01/19/18  60 minutes critical care time  Milana ObeyKathleen Dantavious Snowball, MD  Pulmonary and Critical Care Medicine [facility] Pager: [phone]  01/13/2018, 8:40 PM

## 2018-01-13 NOTE — ED Provider Notes (Signed)
[facility]-ICU/STEPDOWN Provider Note   CSN: [id number] Arrival date & time: 01/13/18  9326     History   Chief Complaint Chief Complaint  Patient presents with  . Fall  . Alcohol Intoxication    HPI Michael [name] is a 57 y.o. male.  HPI  57 year old comes in with chief complaint of fall and alcohol intoxication. Level 5 caveat for severe alcohol intoxication.  According to EMS a bystander found patient down by a gas station.  When they arrived they noted that patient had blood coming out of his mouth and was combative.  Patient arrives to the ER combative and has been placed in four-point restraints.  Patient is crying and continually stating "I love you".  According to EMS patient had vodka on him along with insulin.  Patient's blood sugar was over 400.  Past Medical History:  Diagnosis Date  . Diabetes mellitus without complication ([place])   . ETOH abuse     Patient Active Problem List   Diagnosis Date Noted  . Pressure injury of skin 01/14/2018  . Acute respiratory failure with hypoxia and hypercapnia ([place]) 01/13/2018    No past surgical history on file.      Home Medications    Prior to Admission medications   Medication Sig Start Date End Date Taking? Authorizing Provider  insulin glargine (LANTUS) 100 UNIT/ML injection Inject 12 Units into the skin at bedtime.   Yes [provider]  metFORMIN (GLUCOPHAGE) 500 MG tablet Take 500 mg by mouth 2 (two) times daily with a meal.   Yes [provider]  HYDROcodone-acetaminophen (NORCO/VICODIN) 5-325 MG tablet Take 1-2 tablets by mouth every 6 hours as needed for pain and/or cough. Patient not taking: Reported on 01/13/2018 12/22/17   Pisciotta, Elmyra Ricks, PA-C  ibuprofen (ADVIL,MOTRIN) 800 MG tablet Take 1 tablet (800 mg total) by mouth 3 (three) times daily. 12/22/17   Pisciotta, Elmyra Ricks, PA-C  thiamine (VITAMIN B-1) 100 MG tablet Take 1 tablet (100 mg total) by mouth daily. Patient  not taking: Reported on 12/09/2017 11/26/17   Duffy Bruce, MD    Family History No family history on file.  Social History Social History   Tobacco Use  . Smoking status: Unknown If Ever Smoked  . Smokeless tobacco: Never Used  Substance Use Topics  . Alcohol use: Yes  . Drug use: No     Allergies   Lisinopril   Review of Systems Review of Systems  Unable to perform ROS: Mental status change     Physical Exam Updated Vital Signs BP (!) 211/107   Pulse 72   Temp 98.6 F (37 C)   Resp 19   Wt 81.7 kg (180 lb 1.9 oz)   SpO2 100%   BMI 24.43 kg/m   Physical Exam  HENT:  Hematoma to the forehead. No active bleeding  Eyes:  2 mm and equal  Neck:  No step-offs  Cardiovascular: Normal rate.  Pulmonary/Chest: Effort normal.  Abdominal: Soft.  Neurological:  Confused, moving all 4 extremities, not responding to verbal stimuli  Skin: Skin is warm.  Patient has ecchymosis to his lower extremity  Nursing note and vitals reviewed.    ED Treatments / Results  Labs (all labs ordered are listed, but only abnormal results are displayed) Labs Reviewed  CBC WITH DIFFERENTIAL/PLATELET - Abnormal; Notable for the following components:      Result Value   RBC 4.08 (*)    HCT 36.7 (*)    RDW 16.1 (*)  All other components within normal limits  ETHANOL - Abnormal; Notable for the following components:   Alcohol, Ethyl (B) 455 (*)    All other components within normal limits  COMPREHENSIVE METABOLIC PANEL - Abnormal; Notable for the following components:   Sodium 149 (*)    Chloride 113 (*)    CO2 20 (*)    Glucose, Bld 420 (*)    Total Protein 8.4 (*)    ALT 12 (*)    Anion gap 16 (*)    All other components within normal limits  ACETAMINOPHEN LEVEL - Abnormal; Notable for the following components:   Acetaminophen (Tylenol), Serum <10 (*)    All other components within normal limits  BLOOD GAS, VENOUS - Abnormal; Notable for the following components:    pCO2, Ven 35.8 (*)    pO2, Ven 55.6 (*)    Bicarbonate 18.6 (*)    Acid-base deficit 6.1 (*)    All other components within normal limits  COMPREHENSIVE METABOLIC PANEL - Abnormal; Notable for the following components:   Sodium 148 (*)    Chloride 115 (*)    Glucose, Bld 208 (*)    ALT 12 (*)    All other components within normal limits  ETHANOL - Abnormal; Notable for the following components:   Alcohol, Ethyl (B) 231 (*)    All other components within normal limits  CBC WITH DIFFERENTIAL/PLATELET - Abnormal; Notable for the following components:   RBC 3.55 (*)    Hemoglobin 11.1 (*)    HCT 31.5 (*)    RDW 16.0 (*)    All other components within normal limits  CK - Abnormal; Notable for the following components:   Total CK 466 (*)    All other components within normal limits  TROPONIN I - Abnormal; Notable for the following components:   Troponin I 0.06 (*)    All other components within normal limits  BLOOD GAS, ARTERIAL - Abnormal; Notable for the following components:   pH, Arterial 7.166 (*)    pCO2 arterial 55.3 (*)    Bicarbonate 18.8 (*)    Acid-base deficit 9.4 (*)    All other components within normal limits  COMPREHENSIVE METABOLIC PANEL - Abnormal; Notable for the following components:   Sodium 148 (*)    Chloride 119 (*)    CO2 19 (*)    Glucose, Bld 134 (*)    Calcium 7.8 (*)    Albumin 3.4 (*)    ALT 14 (*)    Total Bilirubin 0.2 (*)    All other components within normal limits  MAGNESIUM - Abnormal; Notable for the following components:   Magnesium 1.1 (*)    All other components within normal limits  PHOSPHORUS - Abnormal; Notable for the following components:   Phosphorus 2.0 (*)    All other components within normal limits  TROPONIN I - Abnormal; Notable for the following components:   Troponin I 0.06 (*)    All other components within normal limits  TROPONIN I - Abnormal; Notable for the following components:   Troponin I 0.03 (*)    All other  components within normal limits  TROPONIN I - Abnormal; Notable for the following components:   Troponin I 0.03 (*)    All other components within normal limits  BRAIN NATRIURETIC PEPTIDE - Abnormal; Notable for the following components:   B Natriuretic Peptide 187.7 (*)    All other components within normal limits  CBC WITH DIFFERENTIAL/PLATELET - Abnormal; Notable  for the following components:   WBC 11.1 (*)    RBC 3.73 (*)    Hemoglobin 11.7 (*)    HCT 33.8 (*)    RDW 16.3 (*)    Neutro Abs 8.5 (*)    All other components within normal limits  URINALYSIS, ROUTINE W REFLEX MICROSCOPIC - Abnormal; Notable for the following components:   Color, Urine STRAW (*)    Glucose, UA 150 (*)    Hgb urine dipstick SMALL (*)    Bacteria, UA RARE (*)    All other components within normal limits  PHOSPHORUS - Abnormal; Notable for the following components:   Phosphorus 2.2 (*)    All other components within normal limits  MAGNESIUM - Abnormal; Notable for the following components:   Magnesium 1.0 (*)    All other components within normal limits  BLOOD GAS, ARTERIAL - Abnormal; Notable for the following components:   pH, Arterial 7.483 (*)    pCO2 arterial 30.6 (*)    All other components within normal limits  BASIC METABOLIC PANEL - Abnormal; Notable for the following components:   Sodium 150 (*)    Potassium 3.4 (*)    Chloride 118 (*)    Glucose, Bld 175 (*)    Calcium 8.0 (*)    All other components within normal limits  CBC - Abnormal; Notable for the following components:   RBC 3.50 (*)    Hemoglobin 11.0 (*)    HCT 31.2 (*)    RDW 16.3 (*)    All other components within normal limits  GLUCOSE, CAPILLARY - Abnormal; Notable for the following components:   Glucose-Capillary 60 (*)    All other components within normal limits  BLOOD GAS, ARTERIAL - Abnormal; Notable for the following components:   pH, Arterial 7.565 (*)    pCO2 arterial 24.2 (*)    pO2, Arterial 110 (*)     All other components within normal limits  GLUCOSE, CAPILLARY - Abnormal; Notable for the following components:   Glucose-Capillary 161 (*)    All other components within normal limits  GLUCOSE, CAPILLARY - Abnormal; Notable for the following components:   Glucose-Capillary 168 (*)    All other components within normal limits  GLUCOSE, CAPILLARY - Abnormal; Notable for the following components:   Glucose-Capillary 43 (*)    All other components within normal limits  GLUCOSE, CAPILLARY - Abnormal; Notable for the following components:   Glucose-Capillary 37 (*)    All other components within normal limits  GLUCOSE, CAPILLARY - Abnormal; Notable for the following components:   Glucose-Capillary 41 (*)    All other components within normal limits  GLUCOSE, CAPILLARY - Abnormal; Notable for the following components:   Glucose-Capillary 47 (*)    All other components within normal limits  GLUCOSE, CAPILLARY - Abnormal; Notable for the following components:   Glucose-Capillary 56 (*)    All other components within normal limits  GLUCOSE, CAPILLARY - Abnormal; Notable for the following components:   Glucose-Capillary 100 (*)    All other components within normal limits  GLUCOSE, CAPILLARY - Abnormal; Notable for the following components:   Glucose-Capillary 173 (*)    All other components within normal limits  GLUCOSE, CAPILLARY - Abnormal; Notable for the following components:   Glucose-Capillary 189 (*)    All other components within normal limits  I-STAT CHEM 8, ED - Abnormal; Notable for the following components:   Sodium 148 (*)    Chloride 113 (*)  Glucose, Bld 408 (*)    Calcium, Ion 1.08 (*)    All other components within normal limits  CBG MONITORING, ED - Abnormal; Notable for the following components:   Glucose-Capillary 287 (*)    All other components within normal limits  CBG MONITORING, ED - Abnormal; Notable for the following components:   Glucose-Capillary 189 (*)     All other components within normal limits  I-STAT CG4 LACTIC ACID, ED - Abnormal; Notable for the following components:   Lactic Acid, Venous 2.34 (*)    All other components within normal limits  CBG MONITORING, ED - Abnormal; Notable for the following components:   Glucose-Capillary 214 (*)    All other components within normal limits  CULTURE, BLOOD (ROUTINE X 2)  MRSA PCR SCREENING  CULTURE, BLOOD (ROUTINE X 2)  CULTURE, RESPIRATORY (NON-EXPECTORATED)  URINE CULTURE  SALICYLATE LEVEL  RAPID URINE DRUG SCREEN, HOSP PERFORMED  MAGNESIUM  PROCALCITONIN  PROCALCITONIN  LACTIC ACID, PLASMA  HIV ANTIBODY (ROUTINE TESTING)  LACTIC ACID, PLASMA  PROCALCITONIN  PROTIME-INR  APTT  GLUCOSE, CAPILLARY  GLUCOSE, CAPILLARY  GLUCOSE, CAPILLARY  PROCALCITONIN  BASIC METABOLIC PANEL  MAGNESIUM  PHOSPHORUS  HEMOGLOBIN A1C  I-STAT CG4 LACTIC ACID, ED    EKG EKG Interpretation  Date/Time:  Saturday January 13 2018 20:08:46 EDT Ventricular Rate:  125 PR Interval:    QRS Duration: 86 QT Interval:  329 QTC Calculation: 475 R Axis:   41 Text Interpretation:  Sinus tachycardia Borderline T abnormalities, inferior leads No previous ECGs available Confirmed by Gareth Morgan [phone]) on 01/14/2018 5:58:04 PM   Radiology Ct Head Wo Contrast  Result Date: 01/13/2018 CLINICAL DATA:  Patient found down. Blood coming out of mouth. Concern for head or cervical spine injury. Hyperglycemia. EXAM: CT HEAD WITHOUT CONTRAST CT CERVICAL SPINE WITHOUT CONTRAST TECHNIQUE: Multidetector CT imaging of the head and cervical spine was performed following the standard protocol without intravenous contrast. Multiplanar CT image reconstructions of the cervical spine were also generated. COMPARISON:  CT of the head performed 11/26/2017 FINDINGS: CT HEAD FINDINGS Brain: No evidence of acute infarction, hemorrhage, hydrocephalus, extra-axial collection or mass lesion/mass effect. Evaluation is mildly suboptimal  due to motion artifact. The posterior fossa, including the cerebellum, brainstem and fourth ventricle, is within normal limits. The third and lateral ventricles, and basal ganglia are unremarkable in appearance. The cerebral hemispheres are symmetric in appearance, with normal gray-white differentiation. No mass effect or midline shift is seen. Vascular: No hyperdense vessel or unexpected calcification. Skull: There is no evidence of fracture; visualized osseous structures are unremarkable in appearance. Sinuses/Orbits: The orbits are within normal limits. The paranasal sinuses and mastoid air cells are well-aerated. Other: No significant soft tissue abnormalities are seen. CT CERVICAL SPINE FINDINGS Alignment: Normal. Evaluation is significantly suboptimal due to motion artifact. Skull base and vertebrae: No acute fracture. No primary bone lesion or focal pathologic process. Soft tissues and spinal canal: No prevertebral fluid or swelling. No visible canal hematoma. Disc levels: Multilevel disc space narrowing is noted along the cervical spine. Degenerative change is noted about the dens. Upper chest: Scattered blebs are noted at the lung apices. The thyroid gland is grossly unremarkable, though not well assessed due to motion artifact. Calcification is noted at the carotid bifurcations bilaterally. Other: No additional soft tissue abnormalities are seen. IMPRESSION: 1. No evidence of traumatic intracranial injury or fracture. 2. No evidence of fracture or subluxation along the cervical spine. 3. Mild degenerative change along the cervical spine. 4.  Scattered blebs at the lung apices. 5. Calcification at the carotid bifurcations bilaterally. Carotid ultrasound would be helpful for further evaluation, when and as deemed clinically appropriate. Electronically Signed   By: Garald Balding M.D.   On: 01/13/2018 06:43   Ct Angio Chest Pe W And/or Wo Contrast  Result Date: 01/13/2018 CLINICAL DATA:  Pulmonary embolus  suspected, high pretest probability EXAM: CT ANGIOGRAPHY CHEST WITH CONTRAST TECHNIQUE: Multidetector CT imaging of the chest was performed using the standard protocol during bolus administration of intravenous contrast. Multiplanar CT image reconstructions and MIPs were obtained to evaluate the vascular anatomy. CONTRAST:  <See Chart> ISOVUE-370 IOPAMIDOL (ISOVUE-370) INJECTION 76% COMPARISON:  Chest x-ray performed today FINDINGS: Cardiovascular: No visible filling defects in the pulmonary arteries to suggest pulmonary emboli. Motion degrades image quality in the lung bases. Heart is borderline in size. Aorta is normal caliber. Mediastinum/Nodes: No mediastinal, hilar, or axillary adenopathy. Lungs/Pleura: Apical blebs/bullae. Ground-glass airspace opacities noted in both lower lobes as well as in the lingula, most pronounced in the right lower lobe where there is nodular ground-glass airspace disease. Favor early pneumonia. No effusions. Upper Abdomen: Imaging into the upper abdomen shows no acute findings. Musculoskeletal: Chest wall soft tissues are unremarkable. Compression fractures noted in the mid thoracic spine, age indeterminate. Review of the MIP images confirms the above findings. IMPRESSION: No evidence of pulmonary embolus. Motion degrades image quality in the lung bases. Cardiomegaly. Ground-glass airspace disease, most pronounced in the right lower lobe also noted in the left lower lobe and lingula concerning for early multifocal pneumonia. Multiple mild to moderate compression fractures in the midthoracic spine, age indeterminate. Electronically Signed   By: Rolm Baptise M.D.   On: 01/13/2018 18:25   Ct Cervical Spine Wo Contrast  Result Date: 01/13/2018 CLINICAL DATA:  Patient found down. Blood coming out of mouth. Concern for head or cervical spine injury. Hyperglycemia. EXAM: CT HEAD WITHOUT CONTRAST CT CERVICAL SPINE WITHOUT CONTRAST TECHNIQUE: Multidetector CT imaging of the head and  cervical spine was performed following the standard protocol without intravenous contrast. Multiplanar CT image reconstructions of the cervical spine were also generated. COMPARISON:  CT of the head performed 11/26/2017 FINDINGS: CT HEAD FINDINGS Brain: No evidence of acute infarction, hemorrhage, hydrocephalus, extra-axial collection or mass lesion/mass effect. Evaluation is mildly suboptimal due to motion artifact. The posterior fossa, including the cerebellum, brainstem and fourth ventricle, is within normal limits. The third and lateral ventricles, and basal ganglia are unremarkable in appearance. The cerebral hemispheres are symmetric in appearance, with normal gray-white differentiation. No mass effect or midline shift is seen. Vascular: No hyperdense vessel or unexpected calcification. Skull: There is no evidence of fracture; visualized osseous structures are unremarkable in appearance. Sinuses/Orbits: The orbits are within normal limits. The paranasal sinuses and mastoid air cells are well-aerated. Other: No significant soft tissue abnormalities are seen. CT CERVICAL SPINE FINDINGS Alignment: Normal. Evaluation is significantly suboptimal due to motion artifact. Skull base and vertebrae: No acute fracture. No primary bone lesion or focal pathologic process. Soft tissues and spinal canal: No prevertebral fluid or swelling. No visible canal hematoma. Disc levels: Multilevel disc space narrowing is noted along the cervical spine. Degenerative change is noted about the dens. Upper chest: Scattered blebs are noted at the lung apices. The thyroid gland is grossly unremarkable, though not well assessed due to motion artifact. Calcification is noted at the carotid bifurcations bilaterally. Other: No additional soft tissue abnormalities are seen. IMPRESSION: 1. No evidence of traumatic intracranial injury or  fracture. 2. No evidence of fracture or subluxation along the cervical spine. 3. Mild degenerative change  along the cervical spine. 4. Scattered blebs at the lung apices. 5. Calcification at the carotid bifurcations bilaterally. Carotid ultrasound would be helpful for further evaluation, when and as deemed clinically appropriate. Electronically Signed   By: Garald Balding M.D.   On: 01/13/2018 06:43   Dg Chest Port 1 View  Result Date: 01/14/2018 CLINICAL DATA:  Endotracheal tube and enteric tube placement. EXAM: PORTABLE CHEST 1 VIEW COMPARISON:  Chest radiograph performed 01/13/2018 FINDINGS: The patient's endotracheal tube is seen ending 4 cm above the carina. An enteric tube is noted extending below the diaphragm. A small right pleural effusion is noted. Vascular congestion is noted. New patchy bilateral airspace opacification may reflect pulmonary edema or pneumonia. No pneumothorax is seen. The cardiomediastinal silhouette is mildly enlarged. No acute osseous abnormalities are identified. IMPRESSION: 1. Endotracheal tube seen ending 4 cm above the carina. 2. Enteric tube noted extending below the diaphragm. 3. Small right pleural effusion. Vascular congestion and mild cardiomegaly. New patchy bilateral airspace opacification may reflect pulmonary edema or pneumonia. Electronically Signed   By: Garald Balding M.D.   On: 01/14/2018 05:41   Dg Chest Portable 1 View  Result Date: 01/13/2018 CLINICAL DATA:  Endotracheal tube replacement. EXAM: PORTABLE CHEST 1 VIEW COMPARISON:  Chest radiograph performed earlier today at 9:10 p.m. FINDINGS: The patient's endotracheal tube is seen ending 3-4 cm above the carina. An enteric tube is noted extending below the diaphragm. Mildly worsening right basilar airspace opacification is noted. Underlying vascular congestion is seen. Increased interstitial markings are noted. Given appearance on recent CT, findings are concerning for worsening pneumonia. The cardiomediastinal silhouette is borderline normal in size. No acute osseous abnormalities are identified. IMPRESSION: 1.  Endotracheal tube seen ending 3-4 cm above the carina. 2. Mildly worsening pneumonia, particularly on the right. Electronically Signed   By: Garald Balding M.D.   On: 01/13/2018 22:52   Dg Chest Port 1 View  Result Date: 01/13/2018 CLINICAL DATA:  Check gastric catheter placement EXAM: PORTABLE CHEST 1 VIEW COMPARISON:  None. FINDINGS: Gastric catheter is noted coiled within the stomach. Cardiac shadow is within normal limits. Patchy changes are noted in the bases similar to that seen on prior CT. No bony abnormality is noted. IMPRESSION: Gastric catheter within the stomach. Patchy bibasilar changes similar to that seen on recent chest CT. Electronically Signed   By: Inez Catalina M.D.   On: 01/13/2018 21:38   Dg Chest Port 1 View  Result Date: 01/13/2018 CLINICAL DATA:  SOB, pt for med clearance, Pt was found laying on the pavement. [employer] was on scene and found a fifth of vodka in the pt's backpack that was almost gone. Pt is combative and not able to follow commands. EXAM: PORTABLE CHEST 1 VIEW COMPARISON:  None. FINDINGS: The heart size and mediastinal contours are within normal limits. Both lungs are clear. The visualized skeletal structures are unremarkable. IMPRESSION: No active disease. Electronically Signed   By: Nolon Nations M.D.   On: 01/13/2018 16:46    Procedures Procedures (including critical care time)  Medications Ordered in ED Medications  sterile water (preservative free) injection (has no administration in time range)  thiamine (B-1) injection 100 mg (100 mg Intravenous Given 0/62/37 6283)  folic acid injection 1 mg (1 mg Intravenous Given 01/14/18 0919)  LORazepam (ATIVAN) injection 1-2 mg (2 mg Intravenous Given 01/14/18 2236)  0.9 %  sodium chloride infusion (  has no administration in time range)  heparin injection 5,000 Units (5,000 Units Subcutaneous Given 01/14/18 2116)  famotidine (PEPCID) IVPB 20 mg premix (0 mg Intravenous Stopped 01/14/18 2147)  lactated ringers  infusion ( Intravenous New Bag/Given 01/14/18 2117)  ondansetron (ZOFRAN) injection 4 mg (has no administration in time range)  albuterol (PROVENTIL) (2.5 MG/3ML) 0.083% nebulizer solution 2.5 mg (has no administration in time range)  chlorhexidine gluconate (MEDLINE KIT) (PERIDEX) 0.12 % solution 15 mL (15 mLs Mouth Rinse Given 01/14/18 2033)  MEDLINE mouth rinse (15 mLs Mouth Rinse Given 01/14/18 1614)  fentaNYL (SUBLIMAZE) injection 100 mcg (100 mcg Intravenous Given 01/13/18 2221)  fentaNYL (SUBLIMAZE) injection 100 mcg (100 mcg Intravenous Given 01/14/18 1218)  acetaminophen (TYLENOL) solution 650 mg (has no administration in time range)    Or  acetaminophen (TYLENOL) suppository 650 mg (has no administration in time range)  docusate (COLACE) 50 MG/5ML liquid 100 mg (100 mg Per Tube Not Given 01/14/18 2117)  piperacillin-tazobactam (ZOSYN) IVPB 3.375 g (3.375 g Intravenous New Bag/Given 01/14/18 2117)  dexmedetomidine (PRECEDEX) 400 MCG/100ML (4 mcg/mL) infusion (0 mcg/kg/hr  72.6 kg Intravenous Stopped 01/14/18 1710)  hydrALAZINE (APRESOLINE) injection 10 mg (10 mg Intravenous Given 01/14/18 2236)  LORazepam (ATIVAN) 2 MG/ML concentrated solution 1 mg (1 mg Per Tube Not Given 01/14/18 1959)  feeding supplement (VITAL HIGH PROTEIN) liquid 1,000 mL (1,000 mLs Per Tube Given by Other 01/14/18 1423)  dextrose 10 % infusion ( Intravenous Rate/Dose Change 01/14/18 2033)  insulin aspart (novoLOG) injection 0-9 Units (0 Units Subcutaneous Not Given 01/14/18 1958)  lactated ringers bolus 1,000 mL (0 mLs Intravenous Stopped 01/13/18 0733)  midazolam (VERSED) injection 2 mg (2 mg Intravenous Given 01/13/18 0558)  0.9 %  sodium chloride infusion ( Intravenous Stopped 01/13/18 2134)  ziprasidone (GEODON) injection 10 mg (10 mg Intramuscular Given 01/13/18 1152)  sodium chloride 0.9 % bolus 1,000 mL (0 mLs Intravenous Stopped 01/13/18 1735)  haloperidol lactate (HALDOL) injection 5 mg (5 mg Intravenous Given 01/13/18  1625)  LORazepam (ATIVAN) injection 1 mg (1 mg Intravenous Given 01/13/18 1631)  iopamidol (ISOVUE-370) 76 % injection (100 mLs Intravenous Contrast Given 01/13/18 1805)  LORazepam (ATIVAN) injection 1 mg (1 mg Intravenous Given 01/13/18 1754)  cefTRIAXone (ROCEPHIN) 1 g in sodium chloride 0.9 % 100 mL IVPB (0 g Intravenous Stopped 01/13/18 1945)  azithromycin (ZITHROMAX) 500 mg in sodium chloride 0.9 % 250 mL IVPB (0 mg Intravenous Stopped 01/13/18 2005)  sodium chloride 0.9 % bolus 1,000 mL (0 mLs Intravenous Stopped 01/13/18 2133)  dexmedetomidine (PRECEDEX) bolus via infusion 72.6 mcg (72.6 mcg Intravenous Bolus from Bag 01/13/18 2009)  etomidate (AMIDATE) injection (20 mg Intravenous Given 01/13/18 2110)  rocuronium (ZEMURON) injection (100 mg Intravenous Given 01/13/18 2110)  etomidate (AMIDATE) injection (20 mg Intravenous Given 01/13/18 2232)  rocuronium (ZEMURON) injection (100 mg Intravenous Given 01/13/18 2232)  dextrose 50 % solution 25 mL (25 mLs Intravenous Given 01/13/18 2331)  sodium bicarbonate injection 100 mEq (100 mEq Intravenous Given 01/14/18 0032)  magnesium sulfate IVPB 4 g 100 mL (0 g Intravenous Stopped 01/14/18 0730)  potassium PHOSPHATE 40 mEq in dextrose 5 % 500 mL infusion (0 mEq Intravenous Stopped 01/14/18 1131)  cloNIDine (CATAPRES) tablet 0.2 mg (0.2 mg Per Tube Given 01/14/18 0531)  magnesium sulfate IVPB 2 g 50 mL (0 g Intravenous Stopped 01/14/18 1348)  dextrose 50 % solution (25 mLs  Given 01/14/18 1612)  dextrose 50 % solution (50 mLs  Given 01/14/18 1633)     Initial  Impression / Assessment and Plan / ED Course  I have reviewed the triage vital signs and the nursing notes.  Pertinent labs & imaging results that were available during my care of the patient were reviewed by me and considered in my medical decision making (see chart for details).     57 year old male comes in with chief complaint of intoxication and fall. Patient is not providing any meaningful  history and is noted to have ecchymosis to his forehead.  CT scan of the head and C-spine has been ordered.  We will also get basic labs and rule out DKA.   7:50 am  Patient was severely agitated and required Versed in order for [place] to get CT scan.  Patient calm after the Versed was delivered.  Patient in the hallway, and the charge nurse has been repeatedly asked to put him on end-tidal CO2 -unfortunately there are no vacant beds according to the charge nurse.  I reassessed patient twice, he still arousable to sternal rub.  Alcohol level shows severe alcohol intoxication.  Patient's care will be signed out to the incoming staff.  Final Clinical Impressions(s) / ED Diagnoses   Final diagnoses:  Alcohol withdrawal seizure with delirium ([place])  Aspiration pneumonia, unspecified aspiration pneumonia type, unspecified laterality, unspecified part of lung [facility])    ED Discharge Orders    None       Varney Biles, MD 01/14/18 2305

## 2018-01-13 NOTE — ED Provider Notes (Addendum)
Physical Exam  BP (!) 181/91 (BP Location: Left Arm)   Pulse (!) 116   Resp 17   SpO2 97%   Physical Exam  ED Course/Procedures     Procedures  MDM  Patient care assumed at 3 pm. Patient was intoxicated last night and was IVC by previous provider due to agitation and given sedation. Apparently patient had a seizure earlier in the day reported by the nurse. Patient awake on my exam and repeat vitals showed hypoxic to 88-90% on RA. Patient has diffuse crackles on exam. He is agitated still and unable to provide much history. Discussed with behavioral health and they said that he is intoxicated and needed to sober up. Plan to get repeat chemistry, ETOH level, CTA chest to r/o PE vs pneumonia.    6:43 PM CTA showed multi focal pneumonia. Repeat chemistry showed sodium still 148. His ETOH is now 230. Concerned for possible aspiration pneumonia. Given rocephin, azithro. Will admit for hypoxia from aspiration pneumonia, alcohol intoxication.   7:14 PM Became tachycardic and agitated. Concerned for alcohol withdrawal seizure. Given multiple doses of ativan, haldol and still agitated and required sitter. I talked to Dr. Adela Glimpseoutova from hospitalist. She request ICU consult. I ordered precedex bolus and drip. ICU to see patient for alcohol withdrawal seizure with agitation, aspiration pneumonia.   9 pm  ICU down to see patient. Patient is altered and more tachypneic on the precedex drip. Patient in respiratory distress and minimally responsive. Intubated for hypoxia and aspiration pneumonia. Confirmed by CXR. Temp foley and OG placed by nursing. ICU to admit.   10 :30 pm ABG showed pH 7.166 shortly after intubation. Increased respiratory rate. When patient was about to go to the ICU, he started gurgling. His cuff appears deflated so I suspect cuff rupture. I tried to exchange it over bougie but was unsuccessful. I paralyzed him again and repeat RSI and re intubated patient. ET tube was confirmed by  CXR and capnometry.   INTUBATION Performed by: Richardean Canalavid H Yao  Required items: required blood products, implants, devices, and special equipment available Patient identity confirmed: provided demographic data and hospital-assigned identification number Time out: Immediately prior to procedure a "time out" was called to verify the correct patient, procedure, equipment, support staff and site/side marked as required.  Indications: hypoxia, respiratory distress  Intubation method: Glidescope Laryngoscopy   Preoxygenation: BVM  Sedatives: 20 mg Etomidate Paralytic: 100 mg rocuronium   Tube Size: 7.5 cuffed  Post-procedure assessment: chest rise and ETCO2 monitor Breath sounds: equal and absent over the epigastrium Tube secured with: ETT holder Chest x-ray interpreted by radiologist and me.  Chest x-ray findings: endotracheal tube in appropriate position  Patient tolerated the procedure well with no immediate complications.     Angiocath insertion Performed by: Richardean Canalavid H Yao  Consent: Verbal consent obtained. Risks and benefits: risks, benefits and alternatives were discussed Time out: Immediately prior to procedure a "time out" was called to verify the correct patient, procedure, equipment, support staff and site/side marked as required.  Preparation: Patient was prepped and draped in the usual sterile fashion.  Vein Location: L antecube  Ultrasound Guided  Gauge: 20   Normal blood return and flush without difficulty Patient tolerance: Patient tolerated the procedure well with no immediate complications.      CRITICAL CARE Performed by: Richardean Canalavid H Yao   Total critical care time:60 minutes  Critical care time was exclusive of separately billable procedures and treating other patients.  Critical care was necessary  to treat or prevent imminent or life-threatening deterioration.  Critical care was time spent personally by me on the following activities: development of  treatment plan with patient and/or surrogate as well as nursing, discussions with consultants, evaluation of patient's response to treatment, examination of patient, obtaining history from patient or surrogate, ordering and performing treatments and interventions, ordering and review of laboratory studies, ordering and review of radiographic studies, pulse oximetry and re-evaluation of patient's condition.    Charlynne Pander, MD 01/13/18 1844    Charlynne Pander, MD 01/13/18 1931    Charlynne Pander, MD 01/13/18 2128    Charlynne Pander, MD 01/13/18 [phone]

## 2018-01-13 NOTE — ED Notes (Signed)
CRITICAL VALUE STICKER  CRITICAL VALUE: Trop 0.06  RECEIVER (on-site recipient of call): Leta JunglingJake T RN  DATE & TIME NOTIFIED: 01/13/18 2246  MESSENGER (representative from lab): Natasha  Pt wheeling upstairs. Pt's nurse made aware and will look for orders.

## 2018-01-13 NOTE — Progress Notes (Signed)
Pharmacy Antibiotic Note  Michael [name] is a 57 y.o. male admitted on 01/13/2018 with aspiration PNA.  Pharmacy has been consulted for Zosyn dosing.  Plan: Zosyn 3.375g IV q8h (4 hour infusion).    . Dosage will likely remain stable at above dosage and need for further dosage adjustment appears unlikely at present.    Will sign off at this time.  Please reconsult if a change in clinical status warrants re-evaluation of dosage.     Weight: 160 lb (72.6 kg)  No data recorded.  Recent Labs  Lab 01/13/18 0535 01/13/18 0547 01/13/18 1616 01/13/18 1851 01/13/18 1907  WBC 6.2  --   --  8.1  --   CREATININE 0.84 1.20 0.72  --   --   LATICACIDVEN  --   --   --   --  2.34*    Estimated Creatinine Clearance: 105.9 mL/min (by C-G formula based on SCr of 0.72 mg/dL).    Allergies  Allergen Reactions  . Lisinopril Swelling    Received ACEi with resultant angioedema, pt previously denied but now recalls previous episode with "blood pressure medicine"      Antimicrobials this admission: 4/20 Zosyn >>    Dose adjustments this admission: ---  Microbiology results: 4/20 BCx: sent 4/20 HIV antibody :    Thank you for allowing pharmacy to be a part of this patient's care.   Adalberto ColeNikola Galdino Hinchman, PharmD, BCPS Pager [phone] 01/13/2018 9:11 PM

## 2018-01-13 NOTE — ED Notes (Signed)
Pt transferred to main ED. PIV inserted, Labs drawn, saline bolus started and medications given. Left unit in bed pushed by RN and nurse tech.Left in stable condition.  Derinda SisVera Lenor Provencher,rn.

## 2018-01-13 NOTE — ED Provider Notes (Signed)
Patient has been rechecked several times.  He is remained intoxicated.  He would not follow direction and stay in his bed.  He became verbally and physically abusive towards the staff.  He was involuntarily committed secondary to safety issues.  Geodon 10 mg IM given.   Donnetta Hutchingook, Kayda Allers, MD 01/13/18 1226

## 2018-01-13 NOTE — ED Triage Notes (Signed)
Pt BIB [facility]. Pt was found laying on the pavement. [employer] was on scene and found a fifth of vodka in the pts backpack that was almost gone. Pt is combative and not able to follow commands. Pt has an abrasion to forehead and knees, small laceration to inside of mouth, bleeding controlled. Fall was unwitnessed, assumed from injuries to pt. Pt has hx of alcoholism.

## 2018-01-13 NOTE — ED Provider Notes (Signed)
Pt rechecked at 1500.  He is resting comfortably.   Donnetta Hutchingook, Shandrea Lusk, MD 01/13/18 [phone]

## 2018-01-13 NOTE — Sedation Documentation (Signed)
Intubation successful, 7.5 mm airway, 22 at the lip.

## 2018-01-13 NOTE — ED Notes (Signed)
Pt has pulled out IV and Condom Cath and was wanting to go out and have a smoke. Security has been called and ED Provider Adriana SimasCook is speaking with pt.   Pt has blood and urine all over floor. Charge RN has been made aware.

## 2018-01-13 NOTE — ED Notes (Signed)
Bed: [id number] Expected date:  Expected time:  Means of arrival:  Comments: TCU 28

## 2018-01-13 NOTE — ED Notes (Signed)
Pt received in room 28. Ambulatory and using crutches accompanied by security officers/[employer]. Pt verbal aggression and agitated. Kicking and not wanting to abide by instructions from staff and/or Designer, television/film setlaw enforcement officers. Dr. Adriana Simasook made aware. Orders placed.

## 2018-01-13 NOTE — ED Notes (Signed)
[facility] Expected date:  Expected time:  Means of arrival:  Comments: EMS 57 yo male from sidewalk-busted lip-intoxicated

## 2018-01-13 NOTE — ED Notes (Signed)
Pt pulled out of bilateral wrist restraints and attempting to get out of bed. Pt reported that he is not under arrest and so we cannot keep him here. Pt reminded that he has been involuntarily committed and therefore cannot leave until medically cleared. Pt began to speak loudly and attempting pulling off condom cath but unsuccessful since it hurt him to do so. Security made aware. Security came and help to curtail the situation.

## 2018-01-13 NOTE — Progress Notes (Signed)
Pt. Noted to have a cuff leak, air added to cuff and then within several seconds, air leak audible again.  Cuff noted to be blown, Dr. Silverio LayYao notified and pt. Was reintubated.

## 2018-01-14 ENCOUNTER — Inpatient Hospital Stay (HOSPITAL_COMMUNITY): Payer: Medicaid Other

## 2018-01-14 DIAGNOSIS — L8990 Pressure ulcer of unspecified site, unspecified stage: Secondary | ICD-10-CM

## 2018-01-14 LAB — GLUCOSE, CAPILLARY
GLUCOSE-CAPILLARY: 100 mg/dL — AB (ref 65–99)
GLUCOSE-CAPILLARY: 161 mg/dL — AB (ref 65–99)
GLUCOSE-CAPILLARY: 168 mg/dL — AB (ref 65–99)
GLUCOSE-CAPILLARY: 173 mg/dL — AB (ref 65–99)
GLUCOSE-CAPILLARY: 233 mg/dL — AB (ref 65–99)
GLUCOSE-CAPILLARY: 43 mg/dL — AB (ref 65–99)
GLUCOSE-CAPILLARY: 84 mg/dL (ref 65–99)
GLUCOSE-CAPILLARY: 97 mg/dL (ref 65–99)
Glucose-Capillary: 37 mg/dL — CL (ref 65–99)
Glucose-Capillary: 41 mg/dL — CL (ref 65–99)
Glucose-Capillary: 47 mg/dL — ABNORMAL LOW (ref 65–99)
Glucose-Capillary: 56 mg/dL — ABNORMAL LOW (ref 65–99)
Glucose-Capillary: 73 mg/dL (ref 65–99)
Glucose-Capillary: 189 mg/dL — ABNORMAL HIGH (ref 65–99)

## 2018-01-14 LAB — PROCALCITONIN

## 2018-01-14 LAB — BASIC METABOLIC PANEL
ANION GAP: 9 (ref 5–15)
BUN: 13 mg/dL (ref 6–20)
CALCIUM: 8 mg/dL — AB (ref 8.9–10.3)
CO2: 23 mmol/L (ref 22–32)
Chloride: 118 mmol/L — ABNORMAL HIGH (ref 101–111)
Creatinine, Ser: 0.74 mg/dL (ref 0.61–1.24)
Glucose, Bld: 175 mg/dL — ABNORMAL HIGH (ref 65–99)
POTASSIUM: 3.4 mmol/L — AB (ref 3.5–5.1)
Sodium: 150 mmol/L — ABNORMAL HIGH (ref 135–145)

## 2018-01-14 LAB — BLOOD GAS, ARTERIAL
Acid-Base Excess: 0.9 mmol/L (ref 0.0–2.0)
Acid-Base Excess: 0.3 mmol/L (ref 0.0–2.0)
Bicarbonate: 21.9 mmol/L (ref 20.0–28.0)
Bicarbonate: 22.6 mmol/L (ref 20.0–28.0)
Drawn by: 308601
Drawn by: 11249
FIO2: 60
FIO2: 50
MECHVT: 620 mL
O2 SAT: 98.6 %
O2 Saturation: 97.8 %
PATIENT TEMPERATURE: 99.2
PCO2 ART: 30.6 mmHg — AB (ref 32.0–48.0)
PEEP: 5 cmH2O
PEEP: 5 cmH2O
PH ART: 7.565 — AB (ref 7.350–7.450)
PO2 ART: 103 mmHg (ref 83.0–108.0)
Patient temperature: 98.6
RATE: 28 resp/min
RATE: 18 resp/min
VT: 620 mL
pCO2 arterial: 24.2 mmHg — ABNORMAL LOW (ref 32.0–48.0)
pH, Arterial: 7.483 — ABNORMAL HIGH (ref 7.350–7.450)
pO2, Arterial: 110 mmHg — ABNORMAL HIGH (ref 83.0–108.0)

## 2018-01-14 LAB — CBC
HCT: 31.2 % — ABNORMAL LOW (ref 39.0–52.0)
HEMOGLOBIN: 11 g/dL — AB (ref 13.0–17.0)
MCH: 31.4 pg (ref 26.0–34.0)
MCHC: 35.3 g/dL (ref 30.0–36.0)
MCV: 89.1 fL (ref 78.0–100.0)
Platelets: 174 10*3/uL (ref 150–400)
RBC: 3.5 MIL/uL — AB (ref 4.22–5.81)
RDW: 16.3 % — ABNORMAL HIGH (ref 11.5–15.5)
WBC: 9.6 10*3/uL (ref 4.0–10.5)

## 2018-01-14 LAB — TROPONIN I
TROPONIN I: 0.03 ng/mL — AB (ref <0.03)
TROPONIN I: 0.03 ng/mL — AB (ref <0.03)

## 2018-01-14 LAB — LACTIC ACID, PLASMA: Lactic Acid, Venous: 1.9 mmol/L (ref 0.5–1.9)

## 2018-01-14 LAB — MAGNESIUM: Magnesium: 1 mg/dL — ABNORMAL LOW (ref 1.7–2.4)

## 2018-01-14 LAB — MRSA PCR SCREENING: MRSA by PCR: NEGATIVE

## 2018-01-14 LAB — HIV ANTIBODY (ROUTINE TESTING W REFLEX): HIV Screen 4th Generation wRfx: NONREACTIVE

## 2018-01-14 LAB — PHOSPHORUS: PHOSPHORUS: 2.2 mg/dL — AB (ref 2.5–4.6)

## 2018-01-14 MED ORDER — INSULIN ASPART 100 UNIT/ML ~~LOC~~ SOLN
0.0000 [IU] | SUBCUTANEOUS | Status: DC
Start: 2018-01-14 — End: 2018-01-17
  Administered 2018-01-14 – 2018-01-15 (×2): 3 [IU] via SUBCUTANEOUS
  Administered 2018-01-15: 2 [IU] via SUBCUTANEOUS
  Administered 2018-01-15: 3 [IU] via SUBCUTANEOUS
  Administered 2018-01-15: 2 [IU] via SUBCUTANEOUS
  Administered 2018-01-15: 5 [IU] via SUBCUTANEOUS
  Administered 2018-01-16: 2 [IU] via SUBCUTANEOUS
  Administered 2018-01-16: 7 [IU] via SUBCUTANEOUS
  Administered 2018-01-16: 9 [IU] via SUBCUTANEOUS
  Administered 2018-01-16: 2 [IU] via SUBCUTANEOUS
  Administered 2018-01-17: 3 [IU] via SUBCUTANEOUS
  Administered 2018-01-17: 2 [IU] via SUBCUTANEOUS

## 2018-01-14 MED ORDER — POTASSIUM PHOSPHATES 15 MMOLE/5ML IV SOLN
40.0000 meq | Freq: Once | INTRAVENOUS | Status: AC
  Administered 2018-01-14: 40 meq via INTRAVENOUS
  Filled 2018-01-14: qty 9.09

## 2018-01-14 MED ORDER — POTASSIUM CHLORIDE 20 MEQ/15ML (10%) PO SOLN: 20.0000 meq | ORAL | Status: DC

## 2018-01-14 MED ORDER — DEXTROSE 50 % IV SOLN
INTRAVENOUS | Status: AC
  Administered 2018-01-14 (×2): 25 mL
  Filled 2018-01-14: qty 50

## 2018-01-14 MED ORDER — HYDRALAZINE HCL 20 MG/ML IJ SOLN
10.0000 mg | INTRAMUSCULAR | Status: DC | PRN
  Administered 2018-01-14 (×3): 10 mg via INTRAVENOUS
  Filled 2018-01-14 (×3): qty 1

## 2018-01-14 MED ORDER — VITAL HIGH PROTEIN PO LIQD
1000.0000 mL | ORAL | Status: DC
  Administered 2018-01-14: 1000 mL
  Filled 2018-01-14 (×2): qty 1000

## 2018-01-14 MED ORDER — MAGNESIUM SULFATE 2 GM/50ML IV SOLN
2.0000 g | Freq: Once | INTRAVENOUS | Status: AC
  Administered 2018-01-14: 2 g via INTRAVENOUS
  Filled 2018-01-14: qty 50

## 2018-01-14 MED ORDER — CLONIDINE ORAL SUSPENSION 10 MCG/ML: 0.2000 mg | Freq: Once | ORAL | Status: DC

## 2018-01-14 MED ORDER — DEXTROSE 50 % IV SOLN
INTRAVENOUS | Status: AC
  Administered 2018-01-14: 50 mL
  Filled 2018-01-14: qty 50

## 2018-01-14 MED ORDER — LORAZEPAM 2 MG/ML PO CONC
1.0000 mg | Freq: Four times a day (QID) | ORAL | Status: DC
  Administered 2018-01-14: 1 mg
  Filled 2018-01-14: qty 1

## 2018-01-14 MED ORDER — CLONIDINE HCL 0.1 MG PO TABS
0.2000 mg | ORAL_TABLET | Freq: Once | ORAL | Status: AC
  Administered 2018-01-14: 0.2 mg
  Filled 2018-01-14: qty 2

## 2018-01-14 MED ORDER — DEXTROSE 10 % IV SOLN
INTRAVENOUS | Status: DC
  Administered 2018-01-14 – 2018-01-16 (×3): via INTRAVENOUS
  Filled 2018-01-14: qty 1000

## 2018-01-14 MED ORDER — SODIUM BICARBONATE 8.4 % IV SOLN
100.0000 meq | Freq: Once | INTRAVENOUS | Status: AC
  Administered 2018-01-14: 100 meq via INTRAVENOUS
  Filled 2018-01-14: qty 50

## 2018-01-14 MED ORDER — MAGNESIUM SULFATE 4 GM/100ML IV SOLN
4.0000 g | Freq: Once | INTRAVENOUS | Status: AC
  Administered 2018-01-14: 4 g via INTRAVENOUS
  Filled 2018-01-14: qty 100

## 2018-01-14 NOTE — Progress Notes (Signed)
eLink Physician-Brief Progress Note Patient Name: Michael [name] DOB: 12/08/1960 MRN: [id number]   Date of Service  01/14/2018  HPI/Events of Note  ABG on 60%/PRVC 28/TV 620/P 5 = 7.56/24.2/110/21.9.  eICU Interventions  Will order: 1. Decrease PRVC to 18. 2. Repeat ABG at 5 AM.      Intervention Category Major Interventions: Acid-Base disturbance - evaluation and management;Respiratory failure - evaluation and management  Sommer,Steven Dennard Nipugene 01/14/2018, 1:57 AM

## 2018-01-14 NOTE — Progress Notes (Signed)
Potassium had already been repleted with Kphos IV. RN then ordered KCL PO. I called [facility] ICU and spoke to RN who said the KCL had not been given yet. I will take out this order.  Patient has remained hypertensive throughout the night, possibly due to ongoing Etoh withdrawal. Has not been getting any Ativan overnight. Will start Ativan PO 1mg  q6hrs scheduled. He still has PRN IV Ativan orders in as well. If patient is hypertensive, agitated, or otherwise triggering CIWA, would encourage RN staff to try IV Ativan Prn first before giving antihypertensive medications.

## 2018-01-14 NOTE — Progress Notes (Signed)
RN entered room and saw that patient had self extubated.  A non-re breather was placed on patient and respiratory was called.  Dr. Vassie LollAlva was paged.  Patient breathing independently placed on 3L [place] saturation in the nineties.  Patient appears to be resting comfortably at this time.  Will continue to monitor.

## 2018-01-14 NOTE — Progress Notes (Signed)
eLink Physician-Brief Progress Note Patient Name: Michael [name] DOB: 06/17/1961 MRN: [id number]   Date of Service  01/14/2018  HPI/Events of Note  Hypertension - BP 192/88. Minimal response to Hydralazine 10 mg IV X 1.   eICU Interventions  Will order: 1. Catapres 0.2 mg per tube X 1.      Intervention Category Major Interventions: Hypertension - evaluation and management  Sommer,Steven Eugene 01/14/2018, 5:10 AM

## 2018-01-14 NOTE — Progress Notes (Signed)
eLink Physician-Brief Progress Note Patient Name: Michael [name] DOB: 09/25/1961 MRN: [id number]   Date of Service  01/14/2018  HPI/Events of Note  ABG on 100%/PRVC 20/TV 620/P 5 = 7.166/55.3/95.5/18.8.  eICU Interventions  Will order: 1. Increase PRVC rate to 28. 2. NaHCO3 100 meq IV now.  2. Repeat ABG at 1:30 AM     Intervention Category Major Interventions: Acid-Base disturbance - evaluation and management;Respiratory failure - evaluation and management  Sommer,Steven Dennard Nipugene 01/14/2018, 12:16 AM

## 2018-01-14 NOTE — Progress Notes (Signed)
eLink Physician-Brief Progress Note Patient Name: Michael [name] DOB: 12/22/1960 MRN: [id number]   Date of Service  01/14/2018  HPI/Events of Note  Hyperglycemia - Blood glucose = 173. Currently on D10W at 50 mL/hour.   eICU Interventions  Will decrease D10W IV infusion to 30 mL/hour.      Intervention Category Major Interventions: Hyperglycemia - active titration of insulin therapy  Lenell AntuSommer,Psalm Arman Eugene 01/14/2018, 8:33 PM

## 2018-01-14 NOTE — H&P (Signed)
PULMONARY / CRITICAL CARE MEDICINE   Name: Michael [name] MRN: [id number] DOB: 05/06/1961    ADMISSION DATE:  01/13/2018 CONSULTATION DATE: 01/13/18  REFERRING MD: Dr Silverio LayYao (ER)  CHIEF COMPLAINT: Etoh withdrawal  HISTORY OF PRESENT ILLNESS:   56yoM with hx Etoh abuse and DM, found today lying outside on pavement with bottle of liquor in backpack. He was immediately agitated on arousal and [facility] had a witnessed seizure at 11:35am (per RN note, SZ was less than 5 minutes in duration). While in the ER patient received Haldol 5mg  IV (@ 1625), Ativan 2mg  IV (@ 1630 and 1754), Versed 2mg  IV (@0558 ), Geodon 10mg  IM (@1152 ), He was eventually intubated for airway protection  SUBJECTIVE:  Intubated, sedated on Precedex drip. Low-grade febrile overnight. Good urine output   VITAL SIGNS: BP 107/67   Pulse 72   Temp 97.7 F (36.5 C)   Resp 18   Wt 180 lb 1.9 oz (81.7 kg)   SpO2 100%   BMI 24.43 kg/m   INTAKE / OUTPUT: I/O last 3 completed shifts: In: 4652.5 [I.V.:893.5; IV Piggyback:3759.1] Out: 850 [Urine:850]  PHYSICAL EXAMINATION: General: Dissheveled Adult male, appears older than stated age, lying on ER stretcher  Neuro: Eyes closed, snoring respirations, grimaces to sternal rub but not waking up. Pupils pinpoint b/l HEENT: OP clear, MM dry Cardiovascular: Tachycardic with a regular rhythm, no m/r/g Lungs: Rhonchi b/l, RR 44 with accessory muscle use present in neck muscles  Abdomen: Soft NTND Musculoskeletal: no LE edema GU: condom catheter in place  Skin: no rashes   LABS:  BMET Recent Labs  Lab 01/13/18 1616 01/13/18 2150 01/14/18 0347  NA 148* 148* 150*  K 4.7 3.5 3.4*  CL 115* 119* 118*  CO2 22 19* 23  BUN 17 13 13   CREATININE 0.72 0.68 0.74  GLUCOSE 208* 134* 175*   Electrolytes Recent Labs  Lab 01/13/18 0535 01/13/18 1616 01/13/18 2150 01/14/18 0347  CALCIUM 9.3 9.0 7.8* 8.0*  MG 2.0  --  1.1* 1.0*  PHOS  --   --  2.0* 2.2*    CBC Recent Labs  Lab 01/13/18 1851 01/13/18 2150 01/14/18 0347  WBC 8.1 11.1* 9.6  HGB 11.1* 11.7* 11.0*  HCT 31.5* 33.8* 31.2*  PLT 177 220 174   Coag's Recent Labs  Lab 01/13/18 2150  APTT 27  INR 1.00    Sepsis Markers Recent Labs  Lab 01/13/18 1907 01/13/18 2150 01/14/18 0110 01/14/18 0347  LATICACIDVEN 2.34* 1.5 1.9  --   PROCALCITON  --  <0.10  <0.10  --  <0.10   ABG Recent Labs  Lab 01/13/18 2150 01/14/18 0135 01/14/18 0336  PHART 7.166* 7.565* 7.483*  PCO2ART 55.3* 24.2* 30.6*  PO2ART 95.5 110* 103    Liver Enzymes Recent Labs  Lab 01/13/18 0535 01/13/18 1616 01/13/18 2150  AST 23 31 25   ALT 12* 12* 14*  ALKPHOS 105 91 92  BILITOT 0.6 0.8 0.2*  ALBUMIN 3.9 3.7 3.4*   Cardiac Enzymes Recent Labs  Lab 01/13/18 2150 01/14/18 0347 01/14/18 0858  TROPONINI 0.06*  0.06* 0.03* 0.03*    Glucose Recent Labs  Lab 01/13/18 1623 01/13/18 2018 01/13/18 2319 01/14/18 0005 01/14/18 0341 01/14/18 0740  GLUCAP 189* 214* 60* 97 161* 168*   Imaging Ct Angio Chest Pe W And/or Wo Contrast  Result Date: 01/13/2018 CLINICAL DATA:  Pulmonary embolus suspected, high pretest probability EXAM: CT ANGIOGRAPHY CHEST WITH CONTRAST TECHNIQUE: Multidetector CT imaging of  the chest was performed using the standard protocol during bolus administration of intravenous contrast. Multiplanar CT image reconstructions and MIPs were obtained to evaluate the vascular anatomy. CONTRAST:  <See Chart> ISOVUE-370 IOPAMIDOL (ISOVUE-370) INJECTION 76% COMPARISON:  Chest x-ray performed today FINDINGS: Cardiovascular: No visible filling defects in the pulmonary arteries to suggest pulmonary emboli. Motion degrades image quality in the lung bases. Heart is borderline in size. Aorta is normal caliber. Mediastinum/Nodes: No mediastinal, hilar, or axillary adenopathy. Lungs/Pleura: Apical blebs/bullae. Ground-glass airspace opacities noted in both lower lobes as well as in the  lingula, most pronounced in the right lower lobe where there is nodular ground-glass airspace disease. Favor early pneumonia. No effusions. Upper Abdomen: Imaging into the upper abdomen shows no acute findings. Musculoskeletal: Chest wall soft tissues are unremarkable. Compression fractures noted in the mid thoracic spine, age indeterminate. Review of the MIP images confirms the above findings. IMPRESSION: No evidence of pulmonary embolus. Motion degrades image quality in the lung bases. Cardiomegaly. Ground-glass airspace disease, most pronounced in the right lower lobe also noted in the left lower lobe and lingula concerning for early multifocal pneumonia. Multiple mild to moderate compression fractures in the midthoracic spine, age indeterminate. Electronically Signed   By: Charlett Nose M.D.   On: 01/13/2018 18:25   Dg Chest Port 1 View  Result Date: 01/14/2018 CLINICAL DATA:  Endotracheal tube and enteric tube placement. EXAM: PORTABLE CHEST 1 VIEW COMPARISON:  Chest radiograph performed 01/13/2018 FINDINGS: The patient's endotracheal tube is seen ending 4 cm above the carina. An enteric tube is noted extending below the diaphragm. A small right pleural effusion is noted. Vascular congestion is noted. New patchy bilateral airspace opacification may reflect pulmonary edema or pneumonia. No pneumothorax is seen. The cardiomediastinal silhouette is mildly enlarged. No acute osseous abnormalities are identified. IMPRESSION: 1. Endotracheal tube seen ending 4 cm above the carina. 2. Enteric tube noted extending below the diaphragm. 3. Small right pleural effusion. Vascular congestion and mild cardiomegaly. New patchy bilateral airspace opacification may reflect pulmonary edema or pneumonia. Electronically Signed   By: Roanna Raider M.D.   On: 01/14/2018 05:41   Dg Chest Portable 1 View  Result Date: 01/13/2018 CLINICAL DATA:  Endotracheal tube replacement. EXAM: PORTABLE CHEST 1 VIEW COMPARISON:  Chest  radiograph performed earlier today at 9:10 p.m. FINDINGS: The patient's endotracheal tube is seen ending 3-4 cm above the carina. An enteric tube is noted extending below the diaphragm. Mildly worsening right basilar airspace opacification is noted. Underlying vascular congestion is seen. Increased interstitial markings are noted. Given appearance on recent CT, findings are concerning for worsening pneumonia. The cardiomediastinal silhouette is borderline normal in size. No acute osseous abnormalities are identified. IMPRESSION: 1. Endotracheal tube seen ending 3-4 cm above the carina. 2. Mildly worsening pneumonia, particularly on the right. Electronically Signed   By: Roanna Raider M.D.   On: 01/13/2018 22:52   Dg Chest Port 1 View  Result Date: 01/13/2018 CLINICAL DATA:  Check gastric catheter placement EXAM: PORTABLE CHEST 1 VIEW COMPARISON:  None. FINDINGS: Gastric catheter is noted coiled within the stomach. Cardiac shadow is within normal limits. Patchy changes are noted in the bases similar to that seen on prior CT. No bony abnormality is noted. IMPRESSION: Gastric catheter within the stomach. Patchy bibasilar changes similar to that seen on recent chest CT. Electronically Signed   By: Alcide Clever M.D.   On: 01/13/2018 21:38   Dg Chest Port 1 View  Result Date: 01/13/2018 CLINICAL DATA:  SOB, pt for med clearance, Pt was found laying on the pavement. [employer] was on scene and found a fifth of vodka in the pt's backpack that was almost gone. Pt is combative and not able to follow commands. EXAM: PORTABLE CHEST 1 VIEW COMPARISON:  None. FINDINGS: The heart size and mediastinal contours are within normal limits. Both lungs are clear. The visualized skeletal structures are unremarkable. IMPRESSION: No active disease. Electronically Signed   By: Norva Pavlov M.D.   On: 01/13/2018 16:46   -Personally reviewed by me  CULTURES: Blood cultures (4/20)>> Sputum culture  (4/20):>>  ANTIBIOTICS: Ceftriaxone 4/20 Azithromycin 4/20  SIGNIFICANT EVENTS: 4/20: found down on pavement, intoxicated and agitated >> had seizure in ER  LINES/TUBES: PIV Condom catheter   DISCUSSION: 56yoM with hx Etoh abuse and DM, found today lying outside on pavement with bottle of liquor in backpack. He was immediately agitated on arousal and was taken to the [facility] ER where he had a witnessed seizure followed by progressive acute hypoxic respiratory failure in setting of aspiration pneumonia, then developed acute encephalopathy and acute hypercapneic respiratory failure following initiation of precedex infusion, requiring intubation.   ASSESSMENT / PLAN:  PULMONARY 1. Acute hypoxic and hypercapneic respiratory failure ; Aspiration pneumonia: -vent settings reviewed and adjusted. Start spontaneous breathing trials - ct  Zosyn for aspiration pna.   CARDIOVASCULAR No active issues   RENAL Hypokalemia/hypophosphatemia/severe hypomagnesemia  -Replete electrolytes aggressively and recheck  GASTROINTESTINAL Mild protein calorie malnutrition -Start tube feeds  HEMATOLOGIC 1. Anemia - Hgb 11 down from baseline of 13-15; no signs of blood loss. Continue to monitor  INFECTIOUS 1. Aspiration Pneumonia -Check MRSA PCR neg. Start Zosyn. -Await culture data  ENDOCRINE 1. DM - NPO; cover with [employer]SI   NEUROLOGIC 1. Etoh intoxication; Seizure thought due to Delirium Tremens; Acute encephalopathy - post intubation will resume precedex gtt; monitor CIWA and given Ativan IV PRN - await  EEG  - start Folate and Thiamine IV daily  FAMILY  - Updates: no family present  - Inter-disciplinary family meet or Palliative Care meeting due by:  01/19/18  My critical care time x 64m  Cyril Mourning MD. [facility]. [place] [facility] Pager [phone] If no response call [phone]    01/14/2018, 10:54 AM

## 2018-01-14 NOTE — Progress Notes (Signed)
[facility] FOR [facility] LAB REPLACEMENT ONLY  The patient does apply for the [facility] Electrolyte [facility]ment Protocol based on the criteria listed below:   1. Is GFR >/= 40 ml/min? Yes.    Patient's GFR today is >60 2. Is urine output >/= 0.5 ml/kg/hr for the last 6 hours? Yes.   Patient's UOP is 1.7 ml/kg/hr 3. Is BUN < 60 mg/dL? Yes.    Patient's BUN today is 13 4. Abnormal electrolyte(s): K-3.4 5. Ordered repletion with: per protocol 6. If a panic level lab has been reported, has the CCM MD in charge been notified? Yes.  .   Physician:  Dr. Janne LabSommer  Trynity Skousen, Dixon BoosMaria Samson 01/14/2018 5:59 AM

## 2018-01-14 NOTE — Progress Notes (Signed)
Hypoglycemic Event  CBG: 43 repeat 37, 41, 47, 56,   Treatment: Dextrose 25ml, 25ml, 50ml  Symptoms: pt. Intubated appears to be asymptomatic  Follow-up CBG: Time:1722 CBG Result:73  Possible Reasons for Event:Tube feeding rate at 20 and possibly insulin scale to high.    Comments/MD notified:Dr. Marcelle SmilingSeong-Joo Jeong on the unit RN directed to place orders for D10 gtt as well as follow up protocol and changing insulin scale to sensitive.  Dr. Vassie LollAlva updated on new orders placed.        Michael [name], Michael [name]

## 2018-01-14 NOTE — Progress Notes (Signed)
eLink Physician-Brief Progress Note Patient Name: Michael [name] DOB: 12/03/1960 MRN: [id number]   Date of Service  01/14/2018  HPI/Events of Note  Hypertension - BP = 192/96.  eICU Interventions  Will order: 1. Hydralazine 10 mg IV Q 4 hours PRN SBP > 170.      Intervention Category Major Interventions: Hypertension - evaluation and management  Sommer,Steven Eugene 01/14/2018, 2:36 AM

## 2018-01-15 ENCOUNTER — Other Ambulatory Visit: Payer: Self-pay

## 2018-01-15 ENCOUNTER — Inpatient Hospital Stay (HOSPITAL_COMMUNITY)
Admit: 2018-01-15 | Discharge: 2018-01-15 | Disposition: A | Discharge status: Home / Self Care | Payer: Medicaid Other | Attending: Pulmonary Disease | Admitting: Pulmonary Disease

## 2018-01-15 ENCOUNTER — Inpatient Hospital Stay (HOSPITAL_COMMUNITY): Payer: Medicaid Other

## 2018-01-15 ENCOUNTER — Encounter (HOSPITAL_COMMUNITY): Payer: Self-pay

## 2018-01-15 DIAGNOSIS — E876 Hypokalemia: Secondary | ICD-10-CM

## 2018-01-15 DIAGNOSIS — R569 Unspecified convulsions: Secondary | ICD-10-CM

## 2018-01-15 DIAGNOSIS — F10239 Alcohol dependence with withdrawal, unspecified: Secondary | ICD-10-CM

## 2018-01-15 LAB — BASIC METABOLIC PANEL
ANION GAP: 13 (ref 5–15)
BUN: 11 mg/dL (ref 6–20)
CALCIUM: 8.4 mg/dL — AB (ref 8.9–10.3)
CHLORIDE: 114 mmol/L — AB (ref 101–111)
CO2: 19 mmol/L — AB (ref 22–32)
Creatinine, Ser: 0.9 mg/dL (ref 0.61–1.24)
GFR calc non Af Amer: >60 mL/min (ref >60)
Glucose, Bld: 124 mg/dL — ABNORMAL HIGH (ref 65–99)
Potassium: 3.2 mmol/L — ABNORMAL LOW (ref 3.5–5.1)
Sodium: 146 mmol/L — ABNORMAL HIGH (ref 135–145)

## 2018-01-15 LAB — GLUCOSE, CAPILLARY
GLUCOSE-CAPILLARY: 152 mg/dL — AB (ref 65–99)
GLUCOSE-CAPILLARY: 205 mg/dL — AB (ref 65–99)
Glucose-Capillary: 180 mg/dL — ABNORMAL HIGH (ref 65–99)
Glucose-Capillary: 264 mg/dL — ABNORMAL HIGH (ref 65–99)
Glucose-Capillary: 223 mg/dL — ABNORMAL HIGH (ref 65–99)
Glucose-Capillary: 221 mg/dL — ABNORMAL HIGH (ref 65–99)

## 2018-01-15 LAB — PROCALCITONIN: Procalcitonin: 0.3 ng/mL

## 2018-01-15 LAB — HEMOGLOBIN A1C
HEMOGLOBIN A1C: 9.8 % — AB (ref 4.8–5.6)
Mean Plasma Glucose: 234.56 mg/dL

## 2018-01-15 LAB — URINE CULTURE: Culture: NO GROWTH

## 2018-01-15 LAB — PHOSPHORUS: PHOSPHORUS: 2.1 mg/dL — AB (ref 2.5–4.6)

## 2018-01-15 LAB — MAGNESIUM: Magnesium: 1.9 mg/dL (ref 1.7–2.4)

## 2018-01-15 MED ORDER — NICARDIPINE HCL IN NACL 20-0.86 MG/200ML-% IV SOLN
3.0000 mg/h | INTRAVENOUS | Status: DC
  Administered 2018-01-15: 5 mg/h via INTRAVENOUS
  Administered 2018-01-15: 7 mg/h via INTRAVENOUS
  Filled 2018-01-15 (×2): qty 200

## 2018-01-15 MED ORDER — SODIUM CHLORIDE 0.9% FLUSH
10.0000 mL | Freq: Two times a day (BID) | INTRAVENOUS | Status: DC
  Administered 2018-01-15 – 2018-01-21 (×9): 10 mL

## 2018-01-15 MED ORDER — POTASSIUM CHLORIDE 10 MEQ/50ML IV SOLN: 10.0000 meq | INTRAVENOUS | Status: DC

## 2018-01-15 MED ORDER — MAGNESIUM SULFATE 2 GM/50ML IV SOLN
2.0000 g | Freq: Once | INTRAVENOUS | Status: AC
  Administered 2018-01-15: 2 g via INTRAVENOUS
  Filled 2018-01-15: qty 50

## 2018-01-15 MED ORDER — POTASSIUM CHLORIDE CRYS ER 20 MEQ PO TBCR
40.0000 meq | EXTENDED_RELEASE_TABLET | Freq: Three times a day (TID) | ORAL | Status: AC
  Administered 2018-01-15 (×2): 40 meq via ORAL
  Filled 2018-01-15 (×2): qty 2

## 2018-01-15 MED ORDER — POTASSIUM CHLORIDE 10 MEQ/100ML IV SOLN
10.0000 meq | INTRAVENOUS | Status: DC
  Administered 2018-01-15 (×2): 10 meq via INTRAVENOUS
  Filled 2018-01-15 (×2): qty 100

## 2018-01-15 MED ORDER — SODIUM CHLORIDE 0.9% FLUSH: 10.0000 mL | INTRAVENOUS | Status: DC | PRN

## 2018-01-15 MED ORDER — AMLODIPINE BESYLATE 5 MG PO TABS
5.0000 mg | ORAL_TABLET | Freq: Every day | ORAL | Status: DC
  Administered 2018-01-15 – 2018-01-22 (×8): 5 mg via ORAL
  Filled 2018-01-15 (×8): qty 1

## 2018-01-15 MED ORDER — HYDRALAZINE HCL 20 MG/ML IJ SOLN
10.0000 mg | INTRAMUSCULAR | Status: DC | PRN
  Administered 2018-01-15: 20 mg via INTRAVENOUS
  Filled 2018-01-15: qty 1

## 2018-01-15 NOTE — Progress Notes (Signed)
[facility] FOR AM LAB REPLACEMENT ONLY  The [facility] based on the criteria listed below:   1. Is GFR >/= 40 ml/min? Yes.    Patient's GFR today is >60 2. Is urine output >/= 0.5 ml/kg/hr for the last 6 hours? Yes.   Patient's UOP is 1.05 ml/kg/hr 3. Is BUN < 60 mg/dL? Yes.    Patient's BUN today is 11 4. Abnormal electrolyte(s): K-3.2 5. Ordered repletion with: per protocol 6. If a panic level lab has been reported, has the CCM MD in charge been notified? Yes.  .   Physician:  Dr. Janne LabSommer  Kemba Hoppes, Dixon BoosMaria Samson 01/15/2018 6:51 AM

## 2018-01-15 NOTE — Progress Notes (Signed)
PULMONARY / CRITICAL CARE MEDICINE   Name: Michael [name] MRN: [id number] DOB: 05/04/1961    ADMISSION DATE:  01/13/2018 CONSULTATION DATE: 01/13/18  REFERRING MD: Dr Silverio LayYao (ER)  CHIEF COMPLAINT: Etoh withdrawal  HISTORY OF PRESENT ILLNESS:   56yoM with hx Etoh abuse and DM, found today lying outside on pavement with bottle of liquor in backpack. He was immediately agitated on arousal and [facility] where he had a witnessed seizure at 11:35am (per RN note, SZ was less than 5 minutes in duration). While in the ER patient received Haldol 5mg  IV (@ 1625), Ativan 2mg  IV (@ 1630 and 1754), Versed 2mg  IV (@0558 ), Geodon 10mg  IM (@1152 ), He was eventually intubated for airway protection  SUBJECTIVE:  No events overnight, no new complaints  VITAL SIGNS: BP (!) 132/52   Pulse 72   Temp 100 F (37.8 C)   Resp 20   Wt 182 lb 15.7 oz (83 kg)   SpO2 99%   BMI 24.82 kg/m   INTAKE / OUTPUT: I/O last 3 completed shifts: In: 3722.6 [I.V.:1813.5; IV Piggyback:1909.1] Out: 4020 [Urine:4020]  PHYSICAL EXAMINATION: General: Up in chair, well appearing, NAD Neuro: Alert and interactive, moving all ext to command HEENT: [place]/AT, PERRL, EOM-I and MMM Cardiovascular: RRR, Nl S1/S2 and -M/R/G Lungs: Bibasilar crackles Abdomen: Soft, NT, ND and +BS Musculoskeletal: no LE edema GU: condom catheter in place  Skin: no rashes   LABS:  BMET Recent Labs  Lab 01/13/18 2150 01/14/18 0347 01/15/18 0335  NA 148* 150* 146*  K 3.5 3.4* 3.2*  CL 119* 118* 114*  CO2 19* 23 19*  BUN 13 13 11   CREATININE 0.68 0.74 0.90  GLUCOSE 134* 175* 124*   Electrolytes Recent Labs  Lab 01/13/18 2150 01/14/18 0347 01/15/18 0335  CALCIUM 7.8* 8.0* 8.4*  MG 1.1* 1.0* 1.9  PHOS 2.0* 2.2* 2.1*   CBC Recent Labs  Lab 01/13/18 1851 01/13/18 2150 01/14/18 0347  WBC 8.1 11.1* 9.6  HGB 11.1* 11.7* 11.0*  HCT 31.5* 33.8* 31.2*  PLT 177 220 174   Coag's Recent Labs  Lab 01/13/18 2150  APTT 27   INR 1.00   Sepsis Markers Recent Labs  Lab 01/13/18 1907 01/13/18 2150 01/14/18 0110 01/14/18 0347 01/15/18 0335  LATICACIDVEN 2.34* 1.5 1.9  --   --   PROCALCITON  --  <0.10  <0.10  --  <0.10 0.30   ABG Recent Labs  Lab 01/13/18 2150 01/14/18 0135 01/14/18 0336  PHART 7.166* 7.565* 7.483*  PCO2ART 55.3* 24.2* 30.6*  PO2ART 95.5 110* 103   Liver Enzymes Recent Labs  Lab 01/13/18 0535 01/13/18 1616 01/13/18 2150  AST 23 31 25   ALT 12* 12* 14*  ALKPHOS 105 91 92  BILITOT 0.6 0.8 0.2*  ALBUMIN 3.9 3.7 3.4*   Cardiac Enzymes Recent Labs  Lab 01/13/18 2150 01/14/18 0347 01/14/18 0858  TROPONINI 0.06*  0.06* 0.03* 0.03*    Glucose Recent Labs  Lab 01/14/18 1829 01/14/18 1959 01/14/18 2111 01/14/18 2329 01/15/18 0432 01/15/18 0803  GLUCAP 100* 173* 189* 233* 152* 180*   Imaging Dg Chest Port 1 View  Result Date: 01/15/2018 CLINICAL DATA:  Acute respiratory failure EXAM: PORTABLE CHEST 1 VIEW COMPARISON:  01/14/2018 FINDINGS: Endotracheal tube and NG tube removed. Improved aeration in the lung bases. Improved lung volume. IMPRESSION: Endotracheal tube removed. Significant improved aeration in the lungs bilaterally. Electronically Signed   By: Marlan Palauharles  Clark M.D.   On: 01/15/2018 07:49   -Personally  reviewed by me  CULTURES: Blood cultures (4/20)>> Sputum culture (4/20):>>  ANTIBIOTICS: Ceftriaxone 4/20 Azithromycin 4/20  SIGNIFICANT EVENTS: 4/20: found down on pavement, intoxicated and agitated >> had seizure in ER  LINES/TUBES: PIV Condom catheter   I reviewed CXR myself, ETT is in good position  DISCUSSION: 56yoM with hx Etoh abuse and DM, found today lying outside on pavement with bottle of liquor in backpack. He was immediately agitated on arousal and was taken to the [facility] ER where he had a witnessed seizure followed by progressive acute hypoxic respiratory failure in setting of aspiration pneumonia, then developed acute encephalopathy and  acute hypercapneic respiratory failure following initiation of precedex infusion, requiring intubation.  Self extubated on 4/21 and has done well.  ASSESSMENT / PLAN:  PULMONARY 1. Acute hypoxic and hypercapneic respiratory failure ; Aspiration pneumonia: - Titrate O2 for sat of 88-92% - Monitor for airway protection - Continue Zosyn for aspiration pna.   CARDIOVASCULAR - Tele monitoring - D/C cardene - Start PO norvasc with holding parameters  RENAL Low K and Mg - Replete electrolytes aggressively and recheck - KVO IVF  GASTROINTESTINAL Mild protein calorie malnutrition - Heart healthy diabetic diet  HEMATOLOGIC 1. Anemia - Hgb 11 down from baseline of 13-15; no signs of blood loss. Continue to monitor  INFECTIOUS 1. Aspiration Pneumonia - Zosyn. - Await culture data  ENDOCRINE 1. DM - CBG - SSI  NEUROLOGIC 1. Etoh intoxication; Seizure thought due to Delirium Tremens; Acute encephalopathy - Post intubation will resume precedex gtt; monitor CIWA and given Ativan IV PRN - Await  EEG - Folate and Thiamine IV daily  FAMILY  - Updates: No family bedside to update, patient updated - Inter-disciplinary family meet or Palliative Care meeting due by:  01/19/18  Transfer to tele and to [facility] service with PCCM off 4/23  Alyson Reedy, M.D. [facility] Pulmonary/Critical Care Medicine. Pager: [phone]. After hours pager: [phone].  01/15/2018, 11:18 AM

## 2018-01-15 NOTE — Progress Notes (Signed)
EEG completed, results pending. 

## 2018-01-15 NOTE — Progress Notes (Signed)
eLink Physician-Brief Progress Note Patient Name: Michael [name] DOB: 05/12/1961 MRN: [id number]   Date of Service  01/15/2018  HPI/Events of Note  Hypertension - BP = 196/79 with MAP = 102.   eICU Interventions  Will order: 1. Change Hydralazine to 10-20 mg IV Q 4 hours PRN SBP > 170 or DBP > 100.. 2. Nicardipine IV infsuion. Titrate to SBP < 170.     Intervention Category Major Interventions: Hypertension - evaluation and management  Chuck Caban Dennard Nipugene 01/15/2018, 12:37 AM

## 2018-01-15 NOTE — Procedures (Signed)
Date of recording 01/15/2018  Referring physician Dr. Dr Molli KnockYacoub  Reason for the study Syncope  Technical Digital EEG recording using 10-20 electrode international system  Description of the recording  when awake posterior dominant rhythm is 9 Hz symmetrical and reactive Epileptiform features were not seen during this recording  Impression The EEG is normal the patient recorded in awake state only.          Electronically signed by Salome Arnturk, Elizbeth Posa Ali, MD at 01/15/2018 10:11 PM

## 2018-01-15 NOTE — Progress Notes (Signed)
Initial Nutrition Assessment  DOCUMENTATION CODES:   (Will assess for malnutrition at follow-up)  INTERVENTION:  - Diet advancement as medically feasible. - RD will attempt NFPE at follow-up. - RD will continue to monitor for nutrition-related needs.   Monitor magnesium, potassium, and phosphorus daily for at least 3 days, MD to replete as needed, as pt is at risk for refeeding syndrome given alcohol abuse hx, current hypokalemia and hypophosphatemia.   NUTRITION DIAGNOSIS:   Inadequate oral intake related to inability to eat as evidenced by NPO status.  GOAL:   Patient will meet greater than or equal to 90% of their needs  MONITOR:   Diet advancement, Weight trends, Labs, Skin  REASON FOR ASSESSMENT:   Consult Enteral/tube feeding initiation and management  ASSESSMENT:   57 year old arrived to the ED with chief complaint of fall and alcohol intoxication. Level 5 caveat for severe alcohol intoxication. According to EMS a bystander found patient down by a gas station.  When they arrived they noted that patient had blood coming out of his mouth and was combative.  Patient arrived to the ED combative and has been placed in four-point restraints. According to EMS patient had vodka on him along with insulin. Patient's blood sugar was over 400. Pt had a witnessed seizure lasting <5 minutes in the ED. Pt was in respiratory distress and minimally responsive so he was intubated and OGT placed in the ED.    Pt was intubated on 4/20. He self-extubated and OGT removed yesterday around 6:00 PM. Pt on [place] at this time, remains NPO. Pt with some confusion during discussion. Pt denies any abdominal pain or nausea. He reports that for the past 3-4 months his appetite has been poor and he has mainly been eating 1 meal/day (dinner) but will sometimes eat twice/day (lunch and dinner).    He reports UBW of 220-230 lbs and states that he weighed this 6,8, or 12 months ago. He then states "[place] was at my  best [place] was when my son was four so that's been 17 years ago." Per review of Care Everywhere, pt weighed 165 lbs on 07/21/17 at [facility]. This indicates +15 lbs since that time. Will continue to monitor weight trends.   Medications reviewed; 100 mg Colace BID, 20 mg IV Pepcid BID, 1 mg IV folic acid/day, sliding scale Novolog, 2 g IV Mg sulfate x1 run yesterday, 10 mEq IV KCl x4 runs today, 40 mEq IV KPhos x1 run yesterday, 100 mg IV thiamine/day.  Labs reviewed; CBGs: 152 and 180 mg/dL this AM, Na: 191146 mmol/L, K: 3.2 mmol/L, Cl: 114 mmol/L, Ca: 8.4 mg/d, Phos: 2.1 mg/dL.   IVF: LR @ 75 mL/hr, D10 @ 30 mL/hr (245 kcal).     NUTRITION - FOCUSED PHYSICAL EXAM:  Did not feel comfortable performing NFPE at this time; will attempt at follow-up if pt more appropriate at that time.   Diet Order:  Diet NPO time specified  EDUCATION NEEDS:   No education needs have been identified at this time  Skin:  Skin Assessment: Skin Integrity Issues: Skin Integrity Issues:: Stage II Stage II: sacrum and R buttocks  Last BM:  PTA/unknown  Height:   Ht Readings from Last 1 Encounters:  12/13/17 6' (1.829 m)    Weight:   Wt Readings from Last 1 Encounters:  01/15/18 182 lb 15.7 oz (83 kg)    Ideal Body Weight:  80.91 kg  BMI:  Body mass index is 24.82 kg/m.  Estimated Nutritional Needs:   Kcal:  1610-9604 (25-28 kcal/kg)  Protein:  100-125 grams (1.2-1.5 grams/kg)  Fluid:  >/= 2 L/day       Trenton Gammon, MS, RD, LDN, [facility] Inpatient Clinical Dietitian Pager # [phone] After hours/weekend pager # [phone]

## 2018-01-15 NOTE — Progress Notes (Signed)
Patient transferred from ICU. VSS. Patient alert and oriented x3. This RN agrees with the previous assessment.

## 2018-01-16 DIAGNOSIS — F17210 Nicotine dependence, cigarettes, uncomplicated: Secondary | ICD-10-CM

## 2018-01-16 DIAGNOSIS — R4182 Altered mental status, unspecified: Secondary | ICD-10-CM

## 2018-01-16 DIAGNOSIS — R413 Other amnesia: Secondary | ICD-10-CM

## 2018-01-16 DIAGNOSIS — F1020 Alcohol dependence, uncomplicated: Secondary | ICD-10-CM

## 2018-01-16 DIAGNOSIS — Z590 Homelessness: Secondary | ICD-10-CM

## 2018-01-16 DIAGNOSIS — L8990 Pressure ulcer of unspecified site, unspecified stage: Secondary | ICD-10-CM

## 2018-01-16 DIAGNOSIS — G4700 Insomnia, unspecified: Secondary | ICD-10-CM

## 2018-01-16 DIAGNOSIS — Z560 Unemployment, unspecified: Secondary | ICD-10-CM

## 2018-01-16 LAB — BASIC METABOLIC PANEL
ANION GAP: 10 (ref 5–15)
BUN: 6 mg/dL (ref 6–20)
CALCIUM: 8.6 mg/dL — AB (ref 8.9–10.3)
CHLORIDE: 108 mmol/L (ref 101–111)
CO2: 22 mmol/L (ref 22–32)
Creatinine, Ser: 0.8 mg/dL (ref 0.61–1.24)
GFR calc non Af Amer: >60 mL/min (ref >60)
Glucose, Bld: 181 mg/dL — ABNORMAL HIGH (ref 65–99)
Potassium: 3.4 mmol/L — ABNORMAL LOW (ref 3.5–5.1)
SODIUM: 140 mmol/L (ref 135–145)

## 2018-01-16 LAB — GLUCOSE, CAPILLARY
GLUCOSE-CAPILLARY: 168 mg/dL — AB (ref 65–99)
GLUCOSE-CAPILLARY: 372 mg/dL — AB (ref 65–99)
GLUCOSE-CAPILLARY: 67 mg/dL (ref 65–99)
Glucose-Capillary: 191 mg/dL — ABNORMAL HIGH (ref 65–99)
Glucose-Capillary: 337 mg/dL — ABNORMAL HIGH (ref 65–99)
Glucose-Capillary: 164 mg/dL — ABNORMAL HIGH (ref 65–99)

## 2018-01-16 LAB — CBC
HCT: 32.3 % — ABNORMAL LOW (ref 39.0–52.0)
HEMOGLOBIN: 11.2 g/dL — AB (ref 13.0–17.0)
MCH: 30.6 pg (ref 26.0–34.0)
MCHC: 34.7 g/dL (ref 30.0–36.0)
MCV: 88.3 fL (ref 78.0–100.0)
Platelets: 184 10*3/uL (ref 150–400)
RBC: 3.66 MIL/uL — AB (ref 4.22–5.81)
RDW: 16.5 % — ABNORMAL HIGH (ref 11.5–15.5)
WBC: 6.9 10*3/uL (ref 4.0–10.5)

## 2018-01-16 LAB — MAGNESIUM: MAGNESIUM: 1.4 mg/dL — AB (ref 1.7–2.4)

## 2018-01-16 LAB — PHOSPHORUS: Phosphorus: 3 mg/dL (ref 2.5–4.6)

## 2018-01-16 MED ORDER — PANTOPRAZOLE SODIUM 20 MG PO TBEC
20.0000 mg | DELAYED_RELEASE_TABLET | Freq: Every day | ORAL | Status: DC
  Administered 2018-01-16 – 2018-01-22 (×7): 20 mg via ORAL
  Filled 2018-01-16 (×7): qty 1

## 2018-01-16 MED ORDER — VITAMIN B-1 100 MG PO TABS
100.0000 mg | ORAL_TABLET | Freq: Every day | ORAL | Status: DC
  Administered 2018-01-17 – 2018-01-22 (×6): 100 mg via ORAL
  Filled 2018-01-16 (×6): qty 1

## 2018-01-16 MED ORDER — SACCHAROMYCES BOULARDII 250 MG PO CAPS
250.0000 mg | ORAL_CAPSULE | Freq: Two times a day (BID) | ORAL | Status: DC
  Administered 2018-01-16 – 2018-01-22 (×12): 250 mg via ORAL
  Filled 2018-01-16 (×12): qty 1

## 2018-01-16 MED ORDER — POTASSIUM CHLORIDE CRYS ER 10 MEQ PO TBCR
40.0000 meq | EXTENDED_RELEASE_TABLET | Freq: Once | ORAL | Status: AC
  Administered 2018-01-16: 40 meq via ORAL
  Filled 2018-01-16: qty 4

## 2018-01-16 MED ORDER — AMOXICILLIN-POT CLAVULANATE 875-125 MG PO TABS
1.0000 | ORAL_TABLET | Freq: Two times a day (BID) | ORAL | Status: DC
  Administered 2018-01-16 – 2018-01-20 (×8): 1 via ORAL
  Filled 2018-01-16 (×8): qty 1

## 2018-01-16 MED ORDER — MAGNESIUM SULFATE 4 GM/100ML IV SOLN
4.0000 g | Freq: Once | INTRAVENOUS | Status: AC
  Administered 2018-01-16: 4 g via INTRAVENOUS
  Filled 2018-01-16: qty 100

## 2018-01-16 MED ORDER — FOLIC ACID 1 MG PO TABS
1.0000 mg | ORAL_TABLET | Freq: Every day | ORAL | Status: DC
  Administered 2018-01-16 – 2018-01-22 (×7): 1 mg via ORAL
  Filled 2018-01-16 (×7): qty 1

## 2018-01-16 MED ORDER — GABAPENTIN 300 MG PO CAPS
300.0000 mg | ORAL_CAPSULE | Freq: Three times a day (TID) | ORAL | Status: DC
  Administered 2018-01-16 – 2018-01-22 (×17): 300 mg via ORAL
  Filled 2018-01-16 (×18): qty 1

## 2018-01-16 NOTE — Progress Notes (Signed)
Hypoglycemic Event CBG 67 8oz Orange Juice given. Will recheck in 

## 2018-01-16 NOTE — Progress Notes (Signed)
PROGRESS NOTE  Michael [name] ZOX:[id number] DOB: 1961-07-07 DOA: 01/13/2018 PCP: System, Pcp Not In  Brief Summary:  Homeless man  with hx of alcohol abuse. Found outside in the street acutely intoxicated with alcohol. Brought in the ED. While in the ED had a witnessed seizure. Was intubated for airway protection. Concern for aspiration on presentation. On IV zosyn empirically.  He self extubated on 4/21.   care transferred  to [facility] on 4/22//2019  HPI/Recap of past 24 hours:   Assessment/Plan: Principal Problem:   Alcohol use disorder, severe, dependence (HCC) Active Problems:   Acute respiratory failure with hypoxia and hypercapnia (HCC)   Pressure injury of skin  ETOH intoxication; Seizure thought due to Delirium Tremens; Acute encephalopathy - He received Precedex drip well on the ventilator .  He self extubated . -Today there is no agitation, no confusion, no seizure, he is on room air . -  EEG unremarkable - continue Folate and Thiamine  -Start neurontin 100mg  TID for alcoholism per psych recommendation  Aspiration pneumonia -He was on Zosyn, changed to Augmentin  Left earlobe abscess:  He is on abx General surgery consulted for possible I/D   Hypokalemia /hypomagnesemia Replace K and mag Keep K greater than 4, mag greater than 2  Insulin dependent dm2 -a1c 9.8 -Home meds Metformin held -SSI   HTN; on norvasc  I have reviewed tele strip, has been in sinus rhythm since hospitalization.  DC telemetry.  Depression: Psychiatry consulted.  Homeless: Child psychotherapist consulted.  Code Status: full  Family Communication: patient   Disposition Plan: d/c in 1-2 days pending clinical improvement   Consultants:  Critical care  Psychiatry  General surgery  Social worker  Diabetes coordinator  Procedures:  Intubation/extubation  Antibiotics:  As above   Objective: BP (!) 156/78 (BP Location: Right Arm)   Pulse 74   Temp 99 F (37.2 C) (Oral)    Resp 20   Ht 5\' 10"  (1.778 m)   Wt 82.4 kg (181 lb 9.6 oz)   SpO2 99%   BMI 26.06 kg/m   Intake/Output Summary (Last 24 hours) at 01/16/2018 1815 Last data filed at 01/16/2018 1421 Gross per 24 hour  Intake 2236.75 ml  Output 1800 ml  Net 436.75 ml   Filed Weights   01/14/18 0500 01/15/18 0402 01/16/18 0326  Weight: 81.7 kg (180 lb 1.9 oz) 83 kg (182 lb 15.7 oz) 82.4 kg (181 lb 9.6 oz)    Exam: Patient is examined daily including today on 01/16/2018, exams remain the same as of yesterday except that has changed    General:  NAD, left earl lobe abscess, no drainage, poor dentition  Cardiovascular: RRR  Respiratory: CTABL  Abdomen: Soft/ND/NT, positive BS  Musculoskeletal: No Edema  Neuro: alert, oriented   Data Reviewed: Basic Metabolic Panel: Recent Labs  Lab 01/13/18 0535  01/13/18 1616 01/13/18 2150 01/14/18 0347 01/15/18 0335 01/16/18 0618  NA 149*   < > 148* 148* 150* 146* 140  K 4.5   < > 4.7 3.5 3.4* 3.2* 3.4*  CL 113*   < > 115* 119* 118* 114* 108  CO2 20*  --  22 19* 23 19* 22  GLUCOSE 420*   < > 208* 134* 175* 124* 181*  BUN 19   < > 17 13 13 11 6   CREATININE 0.84   < > 0.72 0.68 0.74 0.90 0.80  CALCIUM 9.3  --  9.0 7.8* 8.0* 8.4* 8.6*  MG 2.0  --   --  1.1* 1.0* 1.9 1.4*  PHOS  --   --   --  2.0* 2.2* 2.1* 3.0   < > = values in this interval not displayed.   Liver Function Tests: Recent Labs  Lab 01/13/18 0535 01/13/18 1616 01/13/18 2150  AST 23 31 25   ALT 12* 12* 14*  ALKPHOS 105 91 92  BILITOT 0.6 0.8 0.2*  PROT 8.4* 7.7 7.3  ALBUMIN 3.9 3.7 3.4*   No results for input(s): LIPASE, AMYLASE in the last 168 hours. No results for input(s): AMMONIA in the last 168 hours. CBC: Recent Labs  Lab 01/13/18 0535 01/13/18 0547 01/13/18 1851 01/13/18 2150 01/14/18 0347 01/16/18 0618  WBC 6.2  --  8.1 11.1* 9.6 6.9  NEUTROABS 4.0  --  6.7 8.5*  --   --   HGB 13.0 13.6 11.1* 11.7* 11.0* 11.2*  HCT 36.7* 40.0 31.5* 33.8* 31.2* 32.3*  MCV  90.0  --  88.7 90.6 89.1 88.3  PLT 261  --  177 220 174 184   Cardiac Enzymes:   Recent Labs  Lab 01/13/18 2150 01/14/18 0347 01/14/18 0858  CKTOTAL 466*  --   --   TROPONINI 0.06*  0.06* 0.03* 0.03*   BNP (last 3 results) Recent Labs    01/13/18 2150  BNP 187.7*    ProBNP (last 3 results) No results for input(s): PROBNP in the last 8760 hours.  CBG: Recent Labs  Lab 01/15/18 2314 01/16/18 0356 01/16/18 0730 01/16/18 1145 01/16/18 1620  GLUCAP 221* 168* 191* 337* 164*    Recent Results (from the past 240 hour(s))  Blood culture (routine x 2)     Status: None (Preliminary result)   Collection Time: 01/13/18  6:51 PM  Result Value Ref Range Status   Specimen Description   Final    BLOOD RIGHT HAND Blood Culture results may not be optimal due to an inadequate volume of blood received in culture bottles Performed at [facility], [address], [place], [place] [address]    Special Requests   Final    BOTTLES DRAWN AEROBIC ONLY Performed at [facility], [address] [place], [place] [address]    Culture   Final    NO GROWTH 2 DAYS Performed at [facility], [address] [place], [place] [address]    Report Status PENDING  Incomplete  Blood culture (routine x 2)     Status: None (Preliminary result)   Collection Time: 01/13/18  6:51 PM  Result Value Ref Range Status   Specimen Description BLOOD LEFT ANTECUBITAL  Final   Special Requests   Final    BOTTLES DRAWN AEROBIC AND ANAEROBIC Blood Culture adequate volume   Culture   Final    NO GROWTH 2 DAYS Performed at [facility], [address] [place], [place] [address]    Report Status PENDING  Incomplete  Urine culture     Status: None   Collection Time: 01/13/18  9:50 PM  Result Value Ref Range Status   Specimen Description   Final    URINE, RANDOM Performed at [facility], [address] [place], [place] [address]     Special Requests   Final    NONE Performed at [facility], [address], [place], [place] [address]    Culture   Final    NO GROWTH Performed at [facility], [address], [place], [place] [address]  Report Status 01/15/2018 FINAL  Final  MRSA PCR Screening     Status: None   Collection Time: 01/13/18 11:08 PM  Result Value Ref Range Status   MRSA by PCR NEGATIVE NEGATIVE Final    Comment:        The GeneXpert MRSA Assay (FDA approved for NASAL specimens only), is one component of a comprehensive MRSA colonization surveillance program. It is not intended to diagnose MRSA infection nor to guide or monitor treatment for MRSA infections. Performed at [facility], [address] [place], [place] [address]      Studies: No results found.  Scheduled Meds: . amLODipine  5 mg Oral Daily  . docusate  100 mg Per Tube BID  . folic acid  1 mg Intravenous Daily  . gabapentin  300 mg Oral TID  . heparin  5,000 Units Subcutaneous Q8H  . insulin aspart  0-9 Units Subcutaneous Q4H  . LORazepam  1 mg Per Tube Q6H  . sodium chloride flush  10-40 mL Intracatheter Q12H  . thiamine injection  100 mg Intravenous Daily    Continuous Infusions: . sodium chloride    . famotidine (PEPCID) IV Stopped (01/16/18 0926)  . lactated ringers 75 mL/hr at 01/15/18 2110  . piperacillin-tazobactam (ZOSYN)  IV 3.375 g (01/16/18 1547)     Time spent: 35mins I have personally reviewed and interpreted on  01/16/2018 daily labs, tele strips, imagings as discussed above under date review session and assessment and plans.  I reviewed all nursing notes, pharmacy notes, consultant notes,  vitals, pertinent old records  I have discussed plan of care as described above with RN , patient on 01/16/2018   Albertine GratesFang Tadeusz Stahl MD, PhD  [facility] Pager [phone]. If 7PM-7AM, please contact night-coverage at [email or web address], password [email or web address] 01/16/2018, 6:15 PM  LOS: 3  days

## 2018-01-16 NOTE — Consult Note (Signed)
[facility] Surgery Consult Note  Byrne Capek 17-Mar-1961  [id number].    Requesting MD: Florencia Reasons Chief Complaint/Reason for Consult: left earlobe abscess  HPI:  Michael [name] is a 57yo male PMH DM, who was admitted to [facility] 4/20 with aspiration pneumonia. Patient has a h/o alcohol abuse, he was found down outside. In the ED patient had a seizure, thought to be delirium tremens. CT chest showed possible early multifocal pneumonia. He was admitted to the hospital and started on zosyn. Patient developed acute encephalopathy and acute hypercapneic respiratory failure following initiation of precedex infusion, requiring intubation. He has since been extubated and complained of left earlobe pain. General surgery asked to see. Patient states that about 1 year ago he noticed a small nodule on both of his earlobes. No recent piercings or procedures to this area. 4-5 days ago both earlobes swelled up. The right has since resolved. Denies any drainage from either lobe. States that the left earlobe is very tender.    ROS: Review of Systems  Constitutional: Negative.   HENT: Positive for ear pain.   Eyes: Negative.   Respiratory: Negative.   Cardiovascular: Negative.   Gastrointestinal: Negative.   Genitourinary: Negative.   Musculoskeletal: Negative.   Skin: Negative.   Neurological: Negative.    All systems reviewed and otherwise negative except for as above  History reviewed. No pertinent family history.  Past Medical History:  Diagnosis Date  . Diabetes mellitus without complication ([place])   . ETOH abuse     Past Surgical History:  Procedure Laterality Date  . LEFT KNEE AND ANKLE SURGERY      Social History:  reports that he has been smoking cigarettes.  He has never used smokeless tobacco. He reports that he drinks alcohol. He reports that he does not use drugs.  Allergies:  Allergies  Allergen Reactions  . Lisinopril Swelling    Received ACEi with resultant angioedema, pt  previously denied but now recalls previous episode with "blood pressure medicine"      Medications Prior to Admission  Medication Sig Dispense Refill  . insulin glargine (LANTUS) 100 UNIT/ML injection Inject 12 Units into the skin at bedtime.    . metFORMIN (GLUCOPHAGE) 500 MG tablet Take 500 mg by mouth 2 (two) times daily with a meal.    . HYDROcodone-acetaminophen (NORCO/VICODIN) 5-325 MG tablet Take 1-2 tablets by mouth every 6 hours as needed for pain and/or cough. (Patient not taking: Reported on 01/13/2018) 7 tablet 0  . ibuprofen (ADVIL,MOTRIN) 800 MG tablet Take 1 tablet (800 mg total) by mouth 3 (three) times daily. 60 tablet 0  . thiamine (VITAMIN B-1) 100 MG tablet Take 1 tablet (100 mg total) by mouth daily. (Patient not taking: Reported on 12/09/2017) 30 tablet 0    Prior to Admission medications   Medication Sig Start Date End Date Taking? Authorizing Provider  insulin glargine (LANTUS) 100 UNIT/ML injection Inject 12 Units into the skin at bedtime.   Yes [provider]  metFORMIN (GLUCOPHAGE) 500 MG tablet Take 500 mg by mouth 2 (two) times daily with a meal.   Yes [provider]  HYDROcodone-acetaminophen (NORCO/VICODIN) 5-325 MG tablet Take 1-2 tablets by mouth every 6 hours as needed for pain and/or cough. Patient not taking: Reported on 01/13/2018 12/22/17   Pisciotta, Elmyra Ricks, PA-C  ibuprofen (ADVIL,MOTRIN) 800 MG tablet Take 1 tablet (800 mg total) by mouth 3 (three) times daily. 12/22/17   Pisciotta, Elmyra Ricks, PA-C  thiamine (VITAMIN B-1) 100 MG tablet Take  1 tablet (100 mg total) by mouth daily. Patient not taking: Reported on 12/09/2017 11/26/17   Duffy Bruce, MD    Blood pressure (!) 152/83, pulse 96, temperature 99.3 F (37.4 C), resp. rate 20, height _0  (1.778 m), weight 181 lb 9.6 oz (82.4 kg), SpO2 97 %. Physical Exam: General: WD/WN AA male who is laying in bed in NAD Heart: regular, rate, and rhythm.  No obvious murmurs, gallops, or rubs  noted.  Palpable pedal pulses bilaterally Lungs: Respiratory effort nonlabored Abd: soft, NT/ND, +BS, no masses, hernias, or organomegaly MS: no UE/LE edema Skin: warm and dry with no masses, lesions, or rashes Psych: A&Ox3 with a flat affect. Neuro: moving all 4 extremities. normal speech HEENT: head is normocephalic, atraumatic.  Sclera are noninjected.  Pupils equal and round.  Nose without any masses or lesions.  Mouth is pink and moist. Dentition fair. Left earlobe edematous and fluctuant, no definite erythema; no active drainage     Results for orders placed or performed during the hospital encounter of 01/13/18 (from the past 48 hour(s))  Glucose, capillary     Status: Abnormal   Collection Time: 01/14/18  3:41 PM  Result Value Ref Range   Glucose-Capillary 43 (LL) 65 - 99 mg/dL  Glucose, capillary     Status: Abnormal   Collection Time: 01/14/18  3:44 PM  Result Value Ref Range   Glucose-Capillary 37 (LL) 65 - 99 mg/dL   Comment 1 Notify RN   Glucose, capillary     Status: Abnormal   Collection Time: 01/14/18  4:06 PM  Result Value Ref Range   Glucose-Capillary 41 (LL) 65 - 99 mg/dL  Glucose, capillary     Status: Abnormal   Collection Time: 01/14/18  4:28 PM  Result Value Ref Range   Glucose-Capillary 47 (L) 65 - 99 mg/dL  Glucose, capillary     Status: Abnormal   Collection Time: 01/14/18  4:57 PM  Result Value Ref Range   Glucose-Capillary 56 (L) 65 - 99 mg/dL  Glucose, capillary     Status: None   Collection Time: 01/14/18  5:22 PM  Result Value Ref Range   Glucose-Capillary 73 65 - 99 mg/dL  Glucose, capillary     Status: Abnormal   Collection Time: 01/14/18  6:29 PM  Result Value Ref Range   Glucose-Capillary 100 (H) 65 - 99 mg/dL  Glucose, capillary     Status: Abnormal   Collection Time: 01/14/18  7:59 PM  Result Value Ref Range   Glucose-Capillary 173 (H) 65 - 99 mg/dL   Comment 1 Notify RN    Comment 2 Document in Chart   Glucose, capillary      Status: Abnormal   Collection Time: 01/14/18  9:11 PM  Result Value Ref Range   Glucose-Capillary 189 (H) 65 - 99 mg/dL   Comment 1 Notify RN    Comment 2 Document in Chart   Glucose, capillary     Status: Abnormal   Collection Time: 01/14/18 11:29 PM  Result Value Ref Range   Glucose-Capillary 233 (H) 65 - 99 mg/dL   Comment 1 Notify RN    Comment 2 Document in Chart   Procalcitonin     Status: None   Collection Time: 01/15/18  3:35 AM  Result Value Ref Range   Procalcitonin 0.30 ng/mL    Comment:        Interpretation: PCT (Procalcitonin) <= 0.5 ng/mL: Systemic infection (sepsis) is not likely. Local bacterial infection is possible. (  NOTE)       Sepsis PCT Algorithm           Lower Respiratory Tract                                      Infection PCT Algorithm    ----------------------------     ----------------------------         PCT < 0.25 ng/mL                PCT < 0.10 ng/mL         Strongly encourage             Strongly discourage   discontinuation of antibiotics    initiation of antibiotics    ----------------------------     -----------------------------       PCT 0.25 - 0.50 ng/mL            PCT 0.10 - 0.25 ng/mL               OR       >80% decrease in PCT            Discourage initiation of                                            antibiotics      Encourage discontinuation           of antibiotics    ----------------------------     -----------------------------         PCT >= 0.50 ng/mL              PCT 0.26 - 0.50 ng/mL               AND        <80% decrease in PCT             Encourage initiation of                                             antibiotics       Encourage continuation           of antibiotics    ----------------------------     -----------------------------        PCT >= 0.50 ng/mL                  PCT > 0.50 ng/mL               AND         increase in PCT                  Strongly encourage                                      initiation  of antibiotics    Strongly encourage escalation           of antibiotics                                     -----------------------------  PCT <= 0.25 ng/mL                                                 OR                                        > 80% decrease in PCT                                     Discontinue / Do not initiate                                             antibiotics Performed at [facility] [address], [place], [place] [address]   BMET in AM     Status: Abnormal   Collection Time: 01/15/18  3:35 AM  Result Value Ref Range   Sodium 146 (H) 135 - 145 mmol/L   Potassium 3.2 (L) 3.5 - 5.1 mmol/L   Chloride 114 (H) 101 - 111 mmol/L   CO2 19 (L) 22 - 32 mmol/L   Glucose, Bld 124 (H) 65 - 99 mg/dL   BUN 11 6 - 20 mg/dL   Creatinine, Ser 0.90 0.61 - 1.24 mg/dL   Calcium 8.4 (L) 8.9 - 10.3 mg/dL   GFR calc non Af Amer >60 >60 mL/min   GFR calc Af Amer >60 >60 mL/min    Comment: (NOTE) The eGFR has been calculated using the CKD EPI equation. This calculation has not been validated in all clinical situations. eGFR's persistently <60 mL/min signify possible Chronic Kidney Disease.    Anion gap 13 5 - 15    Comment: Performed at [facility], [place] [address] [place], [place] [address]  Magnesium     Status: None   Collection Time: 01/15/18  3:35 AM  Result Value Ref Range   Magnesium 1.9 1.7 - 2.4 mg/dL    Comment: Performed at [facility], [place] [address] [place], [place] [address]  Phosphorus     Status: Abnormal   Collection Time: 01/15/18  3:35 AM  Result Value Ref Range   Phosphorus 2.1 (L) 2.5 - 4.6 mg/dL    Comment: Performed at [facility], [place] [address] [place],  [address]  Glucose, capillary     Status: Abnormal   Collection Time: 01/15/18  4:32 AM  Result Value Ref Range   Glucose-Capillary 152 (H) 65 - 99 mg/dL    Comment 1 Notify RN    Comment 2 Document in Chart   Glucose, capillary     Status: Abnormal   Collection Time: 01/15/18  8:03 AM  Result Value Ref Range   Glucose-Capillary 180 (H) 65 - 99 mg/dL  Glucose, capillary     Status: Abnormal   Collection Time: 01/15/18 11:28 AM  Result Value Ref Range   Glucose-Capillary 205 (H) 65 - 99 mg/dL  Glucose, capillary     Status: Abnormal   Collection Time: 01/15/18  3:26 PM  Result Value Ref Range   Glucose-Capillary 264 (H) 65 -  99 mg/dL   Comment 1 Notify RN    Comment 2 Document in Chart   Glucose, capillary     Status: Abnormal   Collection Time: 01/15/18  9:41 PM  Result Value Ref Range   Glucose-Capillary 223 (H) 65 - 99 mg/dL  Glucose, capillary     Status: Abnormal   Collection Time: 01/15/18 11:14 PM  Result Value Ref Range   Glucose-Capillary 221 (H) 65 - 99 mg/dL  Glucose, capillary     Status: Abnormal   Collection Time: 01/16/18  3:56 AM  Result Value Ref Range   Glucose-Capillary 168 (H) 65 - 99 mg/dL  BMET in AM     Status: Abnormal   Collection Time: 01/16/18  6:18 AM  Result Value Ref Range   Sodium 140 135 - 145 mmol/L   Potassium 3.4 (L) 3.5 - 5.1 mmol/L   Chloride 108 101 - 111 mmol/L   CO2 22 22 - 32 mmol/L   Glucose, Bld 181 (H) 65 - 99 mg/dL   BUN 6 6 - 20 mg/dL   Creatinine, Ser 0.80 0.61 - 1.24 mg/dL   Calcium 8.6 (L) 8.9 - 10.3 mg/dL   GFR calc non Af Amer >60 >60 mL/min   GFR calc Af Amer >60 >60 mL/min    Comment: (NOTE) The eGFR has been calculated using the CKD EPI equation. This calculation has not been validated in all clinical situations. eGFR's persistently <60 mL/min signify possible Chronic Kidney Disease.    Anion gap 10 5 - 15    Comment: Performed at [facility], [place] [address], [place], [place] [address]  CBC     Status: Abnormal   Collection Time: 01/16/18  6:18 AM  Result Value Ref Range   WBC 6.9 4.0 - 10.5 K/uL   RBC 3.66 (L) 4.22 - 5.81 MIL/uL   Hemoglobin  11.2 (L) 13.0 - 17.0 g/dL   HCT 32.3 (L) 39.0 - 52.0 %   MCV 88.3 78.0 - 100.0 fL   MCH 30.6 26.0 - 34.0 pg   MCHC 34.7 30.0 - 36.0 g/dL   RDW 16.5 (H) 11.5 - 15.5 %   Platelets 184 150 - 400 K/uL    Comment: Performed at [facility], [place] [address] [place], [place] [address]  Magnesium     Status: Abnormal   Collection Time: 01/16/18  6:18 AM  Result Value Ref Range   Magnesium 1.4 (L) 1.7 - 2.4 mg/dL    Comment: Performed at [facility], [place] [address], [place], [place] [address]  Phosphorus     Status: None   Collection Time: 01/16/18  6:18 AM  Result Value Ref Range   Phosphorus 3.0 2.5 - 4.6 mg/dL    Comment: Performed at [facility], [place] [address], [place], [place] [address]  Glucose, capillary     Status: Abnormal   Collection Time: 01/16/18  7:30 AM  Result Value Ref Range   Glucose-Capillary 191 (H) 65 - 99 mg/dL  Glucose, capillary     Status: Abnormal   Collection Time: 01/16/18 11:45 AM  Result Value Ref Range   Glucose-Capillary 337 (H) 65 - 99 mg/dL   Comment 1 Notify RN    Comment 2 Document in Chart    Dg Chest Port 1 View  Result Date: 01/15/2018 CLINICAL DATA:  Acute respiratory failure EXAM: PORTABLE CHEST 1 VIEW COMPARISON:  01/14/2018 FINDINGS: Endotracheal tube and NG tube removed. Improved aeration in the  lung bases. Improved lung volume. IMPRESSION: Endotracheal tube removed. Significant improved aeration in the lungs bilaterally. Electronically Signed   By: Franchot Gallo M.D.   On: 01/15/2018 07:49   Anti-infectives (From admission, onward)   Start     Dose/Rate Route Frequency Ordered Stop   01/13/18 2200  piperacillin-tazobactam (ZOSYN) IVPB 3.375 g     3.375 g 12.5 mL/hr over 240 Minutes Intravenous Every 8 hours 01/13/18 2109     01/13/18 1845  cefTRIAXone (ROCEPHIN) 1 g in sodium chloride 0.9 % 100 mL IVPB     1 g 200 mL/hr over 30 Minutes Intravenous  Once 01/13/18 1839 01/13/18 1945     01/13/18 1845  azithromycin (ZITHROMAX) 500 mg in sodium chloride 0.9 % 250 mL IVPB     500 mg 250 mL/hr over 60 Minutes Intravenous  Once 01/13/18 1839 01/13/18 2005        Assessment/Plan Alcohol abuse DM Aspiration pneumonia - on zosyn, per primary  Left earlobe abscess - Patient seen and evaluated with Dr. Hassell Done. Recommend conservative management at this time. Continue antibiotics. Add TID warm compresses. Will continue to follow, but would not recommend I&D at this time.  ID - zosyn 4/20>> VTE - SCDs, heparin FEN - HH/CM diet Foley - none  Wellington Hampshire, [facility] Surgery 01/16/2018, 12:03 PM Pager: [phone] Consults: [phone] Mon-Fri 7:00 am-4:30 pm Sat-Sun 7:00 am-11:30 am

## 2018-01-16 NOTE — Consult Note (Addendum)
[facility] Face-to-Face Psychiatry Consult   Reason for Consult:  Depression and substance abuse. Referring Physician:  Dr. Erlinda Hong Patient Identification: Michael [name] MRN:  [id number] Principal Diagnosis: Alcohol use disorder, severe, dependence ([place]) Diagnosis:   Patient Active Problem List   Diagnosis Date Noted  . Pressure injury of skin [L89.90] 01/14/2018  . Acute respiratory failure with hypoxia and hypercapnia (HCC) [J96.01, J96.02] 01/13/2018    Total Time spent with patient: 1 hour  Subjective:   Michael [name] is a 57 y.o. male patient admitted with AMS on 4/20.  HPI:   Per chart review, patient was found lying outside on the pavement with a bottle of liquor in his backpack. He became agitated and had a witnessed seizure. He was given several behavioral medications for agitation including Ativan, Haldol, Versed and Geodon. He required intubation for airway protection due to acute hypoxic respiratory failure in the setting of aspiration pneumonia.  He self extubated on 4/21.   On interview, Mr. Campton denies a history of anxiety or depression but reports, "feeling depressed since he lost his wallet" at admission. He denies SI, HI or AVH. He reports a past history of seeing shadows in the corners of his eyes. He also reports that he believes that he heard God call his name when he was living in a park in the past. He reports poor sleep and appetite due to his current living condition at a shelter. He planned to live with his brother when he moved to [place] but his sister-in-law would not allow him to live with them. Hereports chronic pain due to an injury he sustained last year while hospitalized when he became agitated due to delirium. He has filed for disability.    Past Psychiatric History: Alcohol abuse   Risk to Self: Is patient at risk for suicide?: No Risk to Others:  None. Denies HI. Prior Inpatient Therapy:  Denies Prior Outpatient Therapy:  Denies   Past Medical History:   Past Medical History:  Diagnosis Date  . Diabetes mellitus without complication ([place])   . ETOH abuse     Past Surgical History:  Procedure Laterality Date  . LEFT KNEE AND ANKLE SURGERY     Family History: History reviewed. No pertinent family history. Family Psychiatric  History: Deneis  Social History:  Social History   Substance and Sexual Activity  Alcohol Use Yes     Social History   Substance and Sexual Activity  Drug Use No    Social History   Socioeconomic History  . Marital status: Unknown    Spouse name: Not on file  . Number of children: Not on file  . Years of education: Not on file  . Highest education level: Not on file  Occupational History  . Not on file  Social Needs  . Financial resource strain: Not on file  . Food insecurity:    Worry: Not on file    Inability: Not on file  . Transportation needs:    Medical: Not on file    Non-medical: Not on file  Tobacco Use  . Smoking status: Current Every Day Smoker    Types: Cigarettes  . Smokeless tobacco: Never Used  Substance and Sexual Activity  . Alcohol use: Yes  . Drug use: No  . Sexual activity: Not on file  Lifestyle  . Physical activity:    Days per week: Not on file    Minutes per session: Not on file  . Stress: Not on file  Relationships  .  Social connections:    Talks on phone: Not on file    Gets together: Not on file    Attends religious service: Not on file    Active member of club or organization: Not on file    Attends meetings of clubs or organizations: Not on file    Relationship status: Not on file  Other Topics Concern  . Not on file  Social History Narrative  . Not on file   Additional Social History: He is from [place]. He lived in [place] for 3 years prior to moving to [place] with his son a few months ago.  He is currently homeless.  He was living at a shelter.  He is unemployed.  He was previously a Administrator for many years.  He reports a history of  heavy alcohol use.  He denies illicit substance use.    Allergies:   Allergies  Allergen Reactions  . Lisinopril Swelling    Received ACEi with resultant angioedema, pt previously denied but now recalls previous episode with "blood pressure medicine"      Labs:  Results for orders placed or performed during the hospital encounter of 01/13/18 (from the past 48 hour(s))  Glucose, capillary     Status: None   Collection Time: 01/14/18 11:34 AM  Result Value Ref Range   Glucose-Capillary 84 65 - 99 mg/dL  Glucose, capillary     Status: Abnormal   Collection Time: 01/14/18  3:41 PM  Result Value Ref Range   Glucose-Capillary 43 (LL) 65 - 99 mg/dL  Glucose, capillary     Status: Abnormal   Collection Time: 01/14/18  3:44 PM  Result Value Ref Range   Glucose-Capillary 37 (LL) 65 - 99 mg/dL   Comment 1 Notify RN   Glucose, capillary     Status: Abnormal   Collection Time: 01/14/18  4:06 PM  Result Value Ref Range   Glucose-Capillary 41 (LL) 65 - 99 mg/dL  Glucose, capillary     Status: Abnormal   Collection Time: 01/14/18  4:28 PM  Result Value Ref Range   Glucose-Capillary 47 (L) 65 - 99 mg/dL  Glucose, capillary     Status: Abnormal   Collection Time: 01/14/18  4:57 PM  Result Value Ref Range   Glucose-Capillary 56 (L) 65 - 99 mg/dL  Glucose, capillary     Status: None   Collection Time: 01/14/18  5:22 PM  Result Value Ref Range   Glucose-Capillary 73 65 - 99 mg/dL  Glucose, capillary     Status: Abnormal   Collection Time: 01/14/18  6:29 PM  Result Value Ref Range   Glucose-Capillary 100 (H) 65 - 99 mg/dL  Glucose, capillary     Status: Abnormal   Collection Time: 01/14/18  7:59 PM  Result Value Ref Range   Glucose-Capillary 173 (H) 65 - 99 mg/dL   Comment 1 Notify RN    Comment 2 Document in Chart   Glucose, capillary     Status: Abnormal   Collection Time: 01/14/18  9:11 PM  Result Value Ref Range   Glucose-Capillary 189 (H) 65 - 99 mg/dL   Comment 1 Notify RN     Comment 2 Document in Chart   Glucose, capillary     Status: Abnormal   Collection Time: 01/14/18 11:29 PM  Result Value Ref Range   Glucose-Capillary 233 (H) 65 - 99 mg/dL   Comment 1 Notify RN    Comment 2 Document in Chart   Procalcitonin  Status: None   Collection Time: 01/15/18  3:35 AM  Result Value Ref Range   Procalcitonin 0.30 ng/mL    Comment:        Interpretation: PCT (Procalcitonin) <= 0.5 ng/mL: Systemic infection (sepsis) is not likely. Local bacterial infection is possible. (NOTE)       Sepsis PCT Algorithm           Lower Respiratory Tract                                      Infection PCT Algorithm    ----------------------------     ----------------------------         PCT < 0.25 ng/mL                PCT < 0.10 ng/mL         Strongly encourage             Strongly discourage   discontinuation of antibiotics    initiation of antibiotics    ----------------------------     -----------------------------       PCT 0.25 - 0.50 ng/mL            PCT 0.10 - 0.25 ng/mL               OR       >80% decrease in PCT            Discourage initiation of                                            antibiotics      Encourage discontinuation           of antibiotics    ----------------------------     -----------------------------         PCT >= 0.50 ng/mL              PCT 0.26 - 0.50 ng/mL               AND        <80% decrease in PCT             Encourage initiation of                                             antibiotics       Encourage continuation           of antibiotics    ----------------------------     -----------------------------        PCT >= 0.50 ng/mL                  PCT > 0.50 ng/mL               AND         increase in PCT                  Strongly encourage                                      initiation of antibiotics    Strongly encourage  escalation           of antibiotics                                     -----------------------------                                            PCT <= 0.25 ng/mL                                                 OR                                        > 80% decrease in PCT                                     Discontinue / Do not initiate                                             antibiotics Performed at [facility] [address], [place], [place] [address]   BMET in AM     Status: Abnormal   Collection Time: 01/15/18  3:35 AM  Result Value Ref Range   Sodium 146 (H) 135 - 145 mmol/L   Potassium 3.2 (L) 3.5 - 5.1 mmol/L   Chloride 114 (H) 101 - 111 mmol/L   CO2 19 (L) 22 - 32 mmol/L   Glucose, Bld 124 (H) 65 - 99 mg/dL   BUN 11 6 - 20 mg/dL   Creatinine, Ser 0.90 0.61 - 1.24 mg/dL   Calcium 8.4 (L) 8.9 - 10.3 mg/dL   GFR calc non Af Amer >60 >60 mL/min   GFR calc Af Amer >60 >60 mL/min    Comment: (NOTE) The eGFR has been calculated using the CKD EPI equation. This calculation has not been validated in all clinical situations. eGFR's persistently <60 mL/min signify possible Chronic Kidney Disease.    Anion gap 13 5 - 15    Comment: Performed at [facility], [place] [address] [place], [place] [address]  Magnesium     Status: None   Collection Time: 01/15/18  3:35 AM  Result Value Ref Range   Magnesium 1.9 1.7 - 2.4 mg/dL    Comment: Performed at [facility], [place] [address] [place], [place] [address]  Phosphorus     Status: Abnormal   Collection Time: 01/15/18  3:35 AM  Result Value Ref Range   Phosphorus 2.1 (L) 2.5 - 4.6 mg/dL    Comment: Performed at [facility], [place] [address] [place],  [address]  Glucose, capillary     Status: Abnormal   Collection Time: 01/15/18  4:32 AM  Result Value Ref Range   Glucose-Capillary 152 (H) 65 - 99 mg/dL   Comment 1 Notify RN    Comment 2 Document  in Chart   Glucose, capillary     Status: Abnormal   Collection Time: 01/15/18  8:03 AM  Result Value Ref  Range   Glucose-Capillary 180 (H) 65 - 99 mg/dL  Glucose, capillary     Status: Abnormal   Collection Time: 01/15/18 11:28 AM  Result Value Ref Range   Glucose-Capillary 205 (H) 65 - 99 mg/dL  Glucose, capillary     Status: Abnormal   Collection Time: 01/15/18  3:26 PM  Result Value Ref Range   Glucose-Capillary 264 (H) 65 - 99 mg/dL   Comment 1 Notify RN    Comment 2 Document in Chart   Glucose, capillary     Status: Abnormal   Collection Time: 01/15/18  9:41 PM  Result Value Ref Range   Glucose-Capillary 223 (H) 65 - 99 mg/dL  Glucose, capillary     Status: Abnormal   Collection Time: 01/15/18 11:14 PM  Result Value Ref Range   Glucose-Capillary 221 (H) 65 - 99 mg/dL  Glucose, capillary     Status: Abnormal   Collection Time: 01/16/18  3:56 AM  Result Value Ref Range   Glucose-Capillary 168 (H) 65 - 99 mg/dL  BMET in AM     Status: Abnormal   Collection Time: 01/16/18  6:18 AM  Result Value Ref Range   Sodium 140 135 - 145 mmol/L   Potassium 3.4 (L) 3.5 - 5.1 mmol/L   Chloride 108 101 - 111 mmol/L   CO2 22 22 - 32 mmol/L   Glucose, Bld 181 (H) 65 - 99 mg/dL   BUN 6 6 - 20 mg/dL   Creatinine, Ser 0.80 0.61 - 1.24 mg/dL   Calcium 8.6 (L) 8.9 - 10.3 mg/dL   GFR calc non Af Amer >60 >60 mL/min   GFR calc Af Amer >60 >60 mL/min    Comment: (NOTE) The eGFR has been calculated using the CKD EPI equation. This calculation has not been validated in all clinical situations. eGFR's persistently <60 mL/min signify possible Chronic Kidney Disease.    Anion gap 10 5 - 15    Comment: Performed at [facility], [place] [address] [place], [place] [address]  CBC     Status: Abnormal   Collection Time: 01/16/18  6:18 AM  Result Value Ref Range   WBC 6.9 4.0 - 10.5 K/uL   RBC 3.66 (L) 4.22 - 5.81 MIL/uL   Hemoglobin 11.2 (L) 13.0 - 17.0 g/dL   HCT 32.3 (L) 39.0 - 52.0 %   MCV 88.3 78.0 - 100.0 fL   MCH 30.6 26.0 - 34.0 pg   MCHC 34.7 30.0 - 36.0 g/dL   RDW 16.5  (H) 11.5 - 15.5 %   Platelets 184 150 - 400 K/uL    Comment: Performed at [facility], [place] [address], [place], [place] [address]  Magnesium     Status: Abnormal   Collection Time: 01/16/18  6:18 AM  Result Value Ref Range   Magnesium 1.4 (L) 1.7 - 2.4 mg/dL    Comment: Performed at [facility], [place] [address], [place], [place] [address]  Phosphorus     Status: None   Collection Time: 01/16/18  6:18 AM  Result Value Ref Range   Phosphorus 3.0 2.5 - 4.6 mg/dL    Comment: Performed at [facility], [place] [address] [place], [place] [address]  Glucose, capillary     Status: Abnormal   Collection Time: 01/16/18  7:30 AM  Result Value Ref Range   Glucose-Capillary 191 (H) 65 - 99 mg/dL    Current Facility-Administered Medications  Medication Dose Route Frequency Provider Last Rate Last Dose  . 0.9 %  sodium chloride infusion  250 mL Intravenous PRN Hammonds, Sharyn Blitz, MD      . acetaminophen (TYLENOL) solution 650 mg  650 mg Oral Q6H PRN Hammonds, Sharyn Blitz, MD       Or  . acetaminophen (TYLENOL) suppository 650 mg  650 mg Rectal Q6H PRN Hammonds, Sharyn Blitz, MD      . albuterol (PROVENTIL) (2.5 MG/3ML) 0.083% nebulizer solution 2.5 mg  2.5 mg Nebulization Q2H PRN Hammonds, Sharyn Blitz, MD      . amLODipine (NORVASC) tablet 5 mg  5 mg Oral Daily Rush Farmer, MD   5 mg at 01/16/18 0856  . docusate (COLACE) 50 MG/5ML liquid 100 mg  100 mg Per Tube BID Hammonds, Sharyn Blitz, MD   100 mg at 01/15/18 1010  . famotidine (PEPCID) IVPB 20 mg premix  20 mg Intravenous Q12H Hammonds, Sharyn Blitz, MD   Stopped at 01/16/18 [phone]  . fentaNYL (SUBLIMAZE) injection 100 mcg  100 mcg Intravenous Q15 min PRN Hammonds, Sharyn Blitz, MD   100 mcg at 01/13/18 2221  . fentaNYL (SUBLIMAZE) injection 100 mcg  100 mcg Intravenous Q2H PRN Hammonds, Sharyn Blitz, MD   100 mcg at 01/14/18 1218  . folic acid injection 1 mg  1 mg Intravenous Daily Hammonds,  Sharyn Blitz, MD   1 mg at 01/16/18 0904  . heparin injection 5,000 Units  5,000 Units Subcutaneous Q8H Hammonds, Sharyn Blitz, MD   5,000 Units at 01/16/18 0519  . hydrALAZINE (APRESOLINE) injection 10-20 mg  10-20 mg Intravenous Q4H PRN Anders Simmonds, MD   20 mg at 01/15/18 0225  . insulin aspart (novoLOG) injection 0-9 Units  0-9 Units Subcutaneous Q4H Renee Pain, MD   2 Units at 01/16/18 [phone]  . lactated ringers infusion   Intravenous Continuous Hammonds, Sharyn Blitz, MD 75 mL/hr at 01/15/18 2110    . LORazepam (ATIVAN) 2 MG/ML concentrated solution 1 mg  1 mg Per Tube Q6H Hammonds, Sharyn Blitz, MD   1 mg at 01/14/18 1512  . LORazepam (ATIVAN) injection 1-2 mg  1-2 mg Intravenous Q1H PRN Hammonds, Sharyn Blitz, MD   2 mg at 01/14/18 2236  . magnesium sulfate IVPB 4 g 100 mL  4 g Intravenous Once Florencia Reasons, MD      . ondansetron [facility]) injection 4 mg  4 mg Intravenous Q6H PRN Hammonds, Sharyn Blitz, MD      . piperacillin-tazobactam (ZOSYN) IVPB 3.375 g  3.375 g Intravenous Q8H Glogovac, Abigail Butts, RPH   Stopped at 01/16/18 0900  . potassium chloride (K-DUR,KLOR-CON) CR tablet 40 mEq  40 mEq Oral Once Florencia Reasons, MD      . sodium chloride flush (NS) 0.9 % injection 10-40 mL  10-40 mL Intracatheter Q12H Hammonds, Sharyn Blitz, MD   10 mL at 01/15/18 1017  . sodium chloride flush (NS) 0.9 % injection 10-40 mL  10-40 mL Intracatheter PRN Hammonds, Sharyn Blitz, MD      . thiamine (B-1) injection 100 mg  100 mg Intravenous Daily Hammonds, Sharyn Blitz, MD   100 mg at 01/16/18 2229    Musculoskeletal: Strength & Muscle Tone: within normal limits Gait & Station: UTA since patient was lying in bed. Patient leans: N/A  Psychiatric Specialty Exam: Physical Exam  Nursing note and vitals reviewed. Constitutional:  He is oriented to person, place, and time. He appears well-developed and well-nourished.  HENT:  Head: Normocephalic.  Neck: Normal range of motion.  Respiratory: Effort normal.  Musculoskeletal:  Normal range of motion.  Neurological: He is alert and oriented to person, place, and time.  Skin: No rash noted.  Psychiatric: He has a normal mood and affect. His speech is normal and behavior is normal. Judgment and thought content normal. Cognition and memory are normal.    Review of Systems  Constitutional: Negative for chills and fever.  Cardiovascular: Negative for chest pain.  Gastrointestinal: Negative for abdominal pain, constipation, diarrhea, nausea and vomiting.  Psychiatric/Behavioral: Positive for depression and memory loss. Negative for hallucinations, substance abuse and suicidal ideas. The patient has insomnia. The patient is not nervous/anxious.     Blood pressure (!) 152/83, pulse 96, temperature 99.3 F (37.4 C), resp. rate 20, height 5' 10"  (1.778 m), weight 82.4 kg (181 lb 9.6 oz), SpO2 97 %.Body mass index is 26.06 kg/m.  General Appearance: Fairly Groomed, middle aged, African American male wearing a hospital gown and lying in bed. NAD.  Eye Contact:  Good  Speech:  Clear and Coherent and Normal Rate  Volume:  Normal  Mood:  "I'm depressed since I realized that I can't find my wallet."  Affect:  Appropriate and Full Range  Thought Process:  Goal Directed, Linear and Descriptions of Associations: Intact  Orientation:  Full (Time, Place, and Person)  Thought Content:  Logical  Suicidal Thoughts:  No  Homicidal Thoughts:  No  Memory:  Immediate;   Good Recent;   Good Remote;   Good  Judgement:  Fair  Insight:  Fair  Psychomotor Activity:  Normal  Concentration:  Concentration: Good and Attention Span: Good  Recall:  Good  Fund of Knowledge:  Good  Language:  Good  Akathisia:  No  Handed:  Right  AIMS (if indicated):   N/A  Assets:  Communication Skills Desire for Improvement  ADL's:  Intact  Cognition:  WNL  Sleep:   Poor   Assessment:  Jaaron Oleson is a 57 y.o. male who was admitted with AMS in the setting of alcohol withdrawal. He denies a history  of anxiety or depression. He reports poor sleep and appetite at this time due to his living conditions at a shelter. He denies SI, HI or AVH. He reports alcohol abuse and is working to get placed at [facility]. He denies other active psychiatric needs at this time.   Treatment Plan Summary: -Can start Gabapentin 300 mg TID for alcoholism. -Patient denies active psychiatric needs at this time. He is already working with someone to be placed at [facility] for substance abuse treatment.  -Please have unit SW provide patient with shelter resources.  -Patient is psychiatrically cleared. Psychiatry will sign off on patient at this time. Please consult psychiatry again as needed.   Disposition: No evidence of imminent risk to self or others at present.   Patient does not meet criteria for psychiatric inpatient admission.  Faythe Dingwall, DO 01/16/2018 11:18 AM

## 2018-01-16 NOTE — Evaluation (Signed)
Physical Therapy Evaluation Patient Details Name: Michael [name] MRN: [id number] DOB: [birth year] Today's Date: 01/16/2018   History of Present Illness  admitted with ETOH, seizures, patient reports history of right  leg fracture, using crutches.  Clinical Impression  The patient demonstrates safe gait with and without his crutches. No PT indicated at this time. PT will sign off.     Follow Up Recommendations No PT follow up    Equipment Recommendations  None recommended by PT    Recommendations for Other Services       Precautions / Restrictions Precautions Precautions: Fall      Mobility  Bed Mobility Overal bed mobility: Independent                Transfers Overall transfer level: Modified independent                  Ambulation/Gait Ambulation/Gait assistance: Modified independent (Device/Increase time) Ambulation Distance (Feet): 150 Feet Assistive device: Crutches Gait Pattern/deviations: Step-to pattern;Step-through pattern     General Gait Details: at times did not use crutches and able to walk  Stairs            Wheelchair Mobility    Modified Rankin (Stroke Patients Only)       Balance                                             Pertinent Vitals/Pain Pain Assessment: Faces Faces Pain Scale: Hurts little more Pain Location: right leg Pain Descriptors / Indicators: Discomfort Pain Intervention(s): Monitored during session    Home Living Family/patient expects to be discharged to:: Unsure                 Additional Comments: lives in a shelter    Prior Function Level of Independence: Independent with assistive device(s)         Comments: crutches     Hand Dominance        Extremity/Trunk Assessment   Upper Extremity Assessment Upper Extremity Assessment: Overall WFL for tasks assessed    Lower Extremity Assessment Lower Extremity Assessment: RLE deficits/detail RLE Deficits / Details:  WFL for WB but noted limp    Cervical / Trunk Assessment Cervical / Trunk Assessment: Normal  Communication   Communication: No difficulties  Cognition Arousal/Alertness: Awake/alert Behavior During Therapy: WFL for tasks assessed/performed Overall Cognitive Status: Within Functional Limits for tasks assessed                                        General Comments      Exercises     Assessment/Plan    PT Assessment Patent does not need any further PT services  PT Problem List         PT Treatment Interventions      PT Goals (Current goals can be found in the Care Plan section)  Acute Rehab PT Goals Patient Stated Goal: none stated PT Goal Formulation: All assessment and education complete, DC therapy    Frequency     Barriers to discharge        Co-evaluation               AM-PAC PT "6 Clicks" Daily Activity  Outcome Measure Difficulty turning over in bed (including adjusting bedclothes, sheets  and blankets)?: None Difficulty moving from lying on back to sitting on the side of the bed? : None Difficulty sitting down on and standing up from a chair with arms (e.g., wheelchair, bedside commode, etc,.)?: None Help needed moving to and from a bed to chair (including a wheelchair)?: None Help needed walking in hospital room?: None Help needed climbing 3-5 steps with a railing? : Total 6 Click Score: 21    End of Session Equipment Utilized During Treatment: Gait belt Activity Tolerance: Patient tolerated treatment well Patient left: in bed;with nursing/sitter in room Nurse Communication: Mobility status PT Visit Diagnosis: Unsteadiness on feet (R26.81)    Time: [id number] PT Time Calculation (min) (ACUTE ONLY): 10 min   Charges:   PT Evaluation $PT Eval Low Complexity: 1 Low     PT G CodesBlanchard Kelch:        ,Jilleen Essner PT [phone]   Rada HayHill, Brenten Janney Elizabeth 01/16/2018, 12:14 PM

## 2018-01-17 DIAGNOSIS — H6002 Abscess of left external ear: Secondary | ICD-10-CM

## 2018-01-17 LAB — GLUCOSE, CAPILLARY
GLUCOSE-CAPILLARY: 221 mg/dL — AB (ref 65–99)
GLUCOSE-CAPILLARY: 209 mg/dL — AB (ref 65–99)
GLUCOSE-CAPILLARY: 246 mg/dL — AB (ref 65–99)
Glucose-Capillary: 143 mg/dL — ABNORMAL HIGH (ref 65–99)
Glucose-Capillary: 183 mg/dL — ABNORMAL HIGH (ref 65–99)
Glucose-Capillary: 236 mg/dL — ABNORMAL HIGH (ref 65–99)

## 2018-01-17 LAB — BASIC METABOLIC PANEL
ANION GAP: 9 (ref 5–15)
BUN: 8 mg/dL (ref 6–20)
CO2: 22 mmol/L (ref 22–32)
Calcium: 8.9 mg/dL (ref 8.9–10.3)
Chloride: 106 mmol/L (ref 101–111)
Creatinine, Ser: 0.96 mg/dL (ref 0.61–1.24)
Glucose, Bld: 249 mg/dL — ABNORMAL HIGH (ref 65–99)
POTASSIUM: 4.3 mmol/L (ref 3.5–5.1)
SODIUM: 137 mmol/L (ref 135–145)

## 2018-01-17 LAB — MAGNESIUM: MAGNESIUM: 1.7 mg/dL (ref 1.7–2.4)

## 2018-01-17 MED ORDER — INSULIN ASPART 100 UNIT/ML ~~LOC~~ SOLN
0.0000 [IU] | Freq: Every day | SUBCUTANEOUS | Status: DC
  Administered 2018-01-17: 2 [IU] via SUBCUTANEOUS
  Administered 2018-01-18 – 2018-01-21 (×4): 3 [IU] via SUBCUTANEOUS

## 2018-01-17 MED ORDER — INSULIN GLARGINE 100 UNIT/ML ~~LOC~~ SOLN
6.0000 [IU] | Freq: Every day | SUBCUTANEOUS | Status: DC
Start: 2018-01-17 — End: 2018-01-18
  Administered 2018-01-17: 6 [IU] via SUBCUTANEOUS
  Filled 2018-01-17 (×2): qty 0.06

## 2018-01-17 MED ORDER — INSULIN ASPART 100 UNIT/ML ~~LOC~~ SOLN
0.0000 [IU] | Freq: Three times a day (TID) | SUBCUTANEOUS | Status: DC
  Administered 2018-01-17 – 2018-01-18 (×4): 3 [IU] via SUBCUTANEOUS
  Administered 2018-01-18 – 2018-01-19 (×2): 5 [IU] via SUBCUTANEOUS
  Administered 2018-01-19: 3 [IU] via SUBCUTANEOUS
  Administered 2018-01-19: 2 [IU] via SUBCUTANEOUS
  Administered 2018-01-20: 3 [IU] via SUBCUTANEOUS
  Administered 2018-01-20: 7 [IU] via SUBCUTANEOUS
  Administered 2018-01-20: 5 [IU] via SUBCUTANEOUS
  Administered 2018-01-21: 9 [IU] via SUBCUTANEOUS
  Administered 2018-01-21: 5 [IU] via SUBCUTANEOUS
  Administered 2018-01-22: 2 [IU] via SUBCUTANEOUS

## 2018-01-17 MED ORDER — ADULT MULTIVITAMIN W/MINERALS CH
1.0000 | ORAL_TABLET | ORAL | Status: DC
  Administered 2018-01-20 – 2018-01-21 (×2): 1 via ORAL
  Filled 2018-01-17 (×2): qty 1

## 2018-01-17 MED ORDER — DOXYCYCLINE HYCLATE 100 MG PO TABS
100.0000 mg | ORAL_TABLET | Freq: Two times a day (BID) | ORAL | Status: DC
  Administered 2018-01-17 – 2018-01-22 (×11): 100 mg via ORAL
  Filled 2018-01-17 (×11): qty 1

## 2018-01-17 NOTE — Progress Notes (Signed)
CBG 143  

## 2018-01-17 NOTE — Progress Notes (Signed)
Inpatient Diabetes Program Recommendations  AACE/ADA: New Consensus Statement on Inpatient Glycemic Control (2015)  Target Ranges:  Prepandial:   less than 140 mg/dL      Peak postprandial:   less than 180 mg/dL (1-2 hours)      Critically ill patients:  140 - 180 mg/dL   Lab Results  Component Value Date   GLUCAP 221 (H) 01/17/2018   HGBA1C 9.8 (H) 01/14/2018    Review of Glycemic Control  Diabetes history: DM2 Outpatient Diabetes medications: Lantus 12 units QHS, metformin 500 mg bid  Current orders for Inpatient glycemic control: Novolog 0-9 units Q4H  HgbA1C - 9.8% - uncontrolled. Pt is homeless and gets meds from MD office of [facility]. Checks blood sugars several times/week. States usually runs high.  Inpatient Diabetes Program Recommendations:     Increase Novolog to 0-9 units tidwc and hs Add Lantus 6 units (1/2 home dose) QHS  Long discussion with pt regarding his glycemic control. States he has no problems in getting his insulin and meds. Checks blood sugars infrequently. Discussed HgbA1C results and goals. Pt states he eats shelter food which is high in CHOs, but he's hungry, therefore has to eat what is provided. Discussed portion control and getting daily exercise. Encouraged pt to check blood sugars more often and f/u with his MD for any needed insulin adjustments.   Discussed with MD, RN, SW.  Thank you. Michael [name], RD, LDN, CDE Inpatient Diabetes Coordinator [phone]

## 2018-01-17 NOTE — Progress Notes (Signed)
CC:  Left ear lobe swollen  Subjective: Still swollen and he says he is trying to pop it.  Still large fluctuant area left ear lobe, not overly tender.    Objective: Vital signs in last 24 hours: Temp:  [99 F (37.2 C)] 99 F (37.2 C) (04/23 1356) Pulse Rate:  [74] 74 (04/23 1356) Resp:  [20] 20 (04/23 1356) BP: (156)/(78) 156/78 (04/23 1356) SpO2:  [99 %] 99 % (04/23 1356) Last BM Date: 01/15/18 720 PO 2550 urine BM x 1 Afebrile, VSS Glucose 249 WBC normal yesterday    Intake/Output from previous day: 04/23 0701 - 04/24 0700 In: 720 [P.O.:720] Out: 2550 [Urine:2550] Intake/Output this shift: No intake/output data recorded.  General appearance: alert, cooperative, no distress and sitter ab bedside Skin: Skin color, texture, turgor normal. No rashes or lesions or large fluctuant area left ear lobe  Lab Results:  Recent Labs    01/16/18 0618  WBC 6.9  HGB 11.2*  HCT 32.3*  PLT 184    BMET Recent Labs    01/16/18 0618 01/17/18 0609  NA 140 137  K 3.4* 4.3  CL 108 106  CO2 22 22  GLUCOSE 181* 249*  BUN 6 8  CREATININE 0.80 0.96  CALCIUM 8.6* 8.9   PT/INR No results for input(s): LABPROT, INR in the last 72 hours.  Recent Labs  Lab 01/13/18 0535 01/13/18 1616 01/13/18 2150  AST 23 31 25   ALT 12* 12* 14*  ALKPHOS 105 91 92  BILITOT 0.6 0.8 0.2*  PROT 8.4* 7.7 7.3  ALBUMIN 3.9 3.7 3.4*     Lipase  No results found for: LIPASE   Medications: . amLODipine  5 mg Oral Daily  . amoxicillin-clavulanate  1 tablet Oral Q12H  . folic acid  1 mg Oral Daily  . gabapentin  300 mg Oral TID  . heparin  5,000 Units Subcutaneous Q8H  . insulin aspart  0-9 Units Subcutaneous Q4H  . pantoprazole  20 mg Oral Daily  . saccharomyces boulardii  250 mg Oral BID  . sodium chloride flush  10-40 mL Intracatheter Q12H  . thiamine  100 mg Oral Daily   Anti-infectives (From admission, onward)   Start     Dose/Rate Route Frequency Ordered Stop   01/16/18  2200  amoxicillin-clavulanate (AUGMENTIN) 875-125 MG per tablet 1 tablet     1 tablet Oral Every 12 hours 01/16/18 1833     01/13/18 2200  piperacillin-tazobactam (ZOSYN) IVPB 3.375 g  Status:  Discontinued     3.375 g 12.5 mL/hr over 240 Minutes Intravenous Every 8 hours 01/13/18 2109 01/16/18 1831   01/13/18 1845  cefTRIAXone (ROCEPHIN) 1 g in sodium chloride 0.9 % 100 mL IVPB     1 g 200 mL/hr over 30 Minutes Intravenous  Once 01/13/18 1839 01/13/18 1945   01/13/18 1845  azithromycin (ZITHROMAX) 500 mg in sodium chloride 0.9 % 250 mL IVPB     500 mg 250 mL/hr over 60 Minutes Intravenous  Once 01/13/18 1839 01/13/18 2005       Assessment/Plan Alcohol abuse DM Aspiration pneumonia - on zosyn, per primary   Left ear lobe abscess  FEN: carb Mod diet ID:   Azithromycin/Rocephin 4/20 x 1 dose; Zosyn 4/21- 4/23;   Augmentin 4/23 =>> day 2 DVT:  Heparin Follow up:  TBD  Plan:  Continue heat to site, on oral antibiotics.       LOS: 4 days    Rashida Ladouceur 01/17/2018 [phone]

## 2018-01-17 NOTE — Clinical Social Work Note (Signed)
Clinical Social Work Assessment  Patient Details  Name: Michael [name] MRN: [id number] Date of Birth: [birth year]  Date of referral:  01/17/18               Reason for consult:  Facility Placement                Permission sought to share information with:  Case Manager, Customer service manager, Guardian, Other Permission granted to share information::  Yes, Verbal Permission Granted  Name::     [place], [place] and Nydia Bouton, Care Corrdinator  Agency::  Residential SA treatment  Relationship::  Guardian and care Administrator, arts Information:     Housing/Transportation Living arrangements for the past 2 months:  [place] of Information:  Patient, Other (Comment Required) Patient Interpreter Needed:  None Criminal Activity/Legal Involvement Pertinent to Current Situation/Hospitalization:  No - Comment as needed Significant Relationships:  [employer] Lives with:  Other (Comment) Do you feel safe going back to the place where you live?  Yes Need for family participation in patient care:  No (Coment)  Care giving concerns:  Patient currently living in shelter and expressed concerns that he has lost his bed due to length of stay in hospital.    Social Worker assessment / plan:  LCSW following for homeless needs.   Patient admitted for [facility] withdrawal.   LCSW met with patient at bedside. No family present. Patient called Care coordinator, Lattie Haw, [phone] from [facility] while LCSW was in the room.   Care coordinator and patient discussed residential SA treatment at dc. Patient expressed interest in [place].   Patient has a legal [place], Azucena Cecil [phone], with [place]. LCSW attempted to reach guardian for collateral and consent to [facility] referral.   PLAN: To be determined after discussion with guardian.      Employment status:  Disabled (Comment on whether or not currently receiving Disability) Insurance information:   Medicaid In Medical laboratory scientific officer) PT Recommendations:  Not assessed at this time Information / Referral to community resources:     Patient/Family's Response to care:  Patient thankful for [place] visit.   Patient/Family's Understanding of and Emotional Response to Diagnosis, Current Treatment, and Prognosis: Patient stated that he needs residential treatment at dc. Patient is agreeable to residential SA treatment.   Emotional Assessment Appearance:  Appears stated age Attitude/Demeanor/Rapport:    Affect (typically observed):  Accepting, Calm Orientation:  Oriented to Self, Oriented to Place, Oriented to  Time, Oriented to Situation Alcohol / Substance use:  Alcohol Use Psych involvement (Current and /or in the community):  No (Comment)  Discharge Needs  Concerns to be addressed:  Substance Abuse Concerns, Homelessness Readmission within the last 30 days:  No Current discharge risk:  None Barriers to Discharge:  Continued Medical Work up   [employer], LCSW 01/17/2018, 11:32 AM

## 2018-01-17 NOTE — Progress Notes (Signed)
Patient ID: Michael [name], male   DOB: 01/04/1961, 57 y.o.   MRN: [id number]  PROGRESS NOTE    Michael [name]  [id number] DOB: 06/28/1961 DOA: 01/13/2018 PCP: System, Pcp Not In   Brief Narrative:  57 year old homeless man with history of alcohol abuse, diabetes mellitus was admitted on 01/13/2018 after he was found outside in the street acutely intoxicated with alcohol.  In the ED, he had a witnessed seizure.  He was intubated for airway protection and started on intravenous Zosyn for aspiration.  He was under the care of critical care team.  He self extubated on 01/14/2018.  His care it was transferred to [facility] on 01/15/2018.  He was transferred out of ICU.  Psychiatry was also consulted who concluded that patient did not have evidence of imminent risk to self or others at present.   Assessment & Plan:   Principal Problem:   Alcohol use disorder, severe, dependence (HCC) Active Problems:   Acute respiratory failure with hypoxia and hypercapnia (HCC)   Pressure injury of skin  Acute hypoxic and hypercapnic respiratory failure -Status post intubation and self extubation on 01/14/2018 -Currently on room air.  Respiratory status stable  Aspiration pneumonia -Initially started on Zosyn.  Antibiotics were switched to oral Augmentin on 01/16/2018 -Cultures negative so far  Acute encephalopathy secondary to alcohol intoxication and seizure thought to be due to delirium tremens -Was on Precedex drip while on ventilator.  Currently off Precedex drip -EEG was unremarkable -Mental status has improved -Psychiatry evaluation appreciated: Psychiatry has signed off.  Continue Neurontin as per psychiatry recommendations -Fall precautions next-continue folate and thiamine  Left earlobe abscess -General surgery following and recommend antibiotic treatment.  Currently on Augmentin.  We will add doxycycline for staph coverage.  No surgical intervention at this time as per general  surgery  Hypokalemia and hypomagnesemia -Resolved.  Repeat a.m. Labs  Diabetes mellitus type 2 with hyperglycemia -Hemoglobin A1c 9.8 -Oral metformin on hold -Start Lantus 6 units at bedtime along with sliding scale coverage.  Hypertension -Monitor blood pressure.  Continue Norvasc  Homelessness -Child psychotherapistocial worker following  DVT prophylaxis: Heparin Code Status: Full Family Communication: None at bedside Disposition Plan: We will follow-up with social worker regarding disposition plan  Consultants: Care/psychiatry/general surgery  Procedures: Intubation and extubation  Antimicrobials:  Anti-infectives (From admission, onward)   Start     Dose/Rate Route Frequency Ordered Stop   01/17/18 1000  doxycycline (VIBRA-TABS) tablet 100 mg     100 mg Oral Every 12 hours 01/17/18 0912     01/16/18 2200  amoxicillin-clavulanate (AUGMENTIN) 875-125 MG per tablet 1 tablet     1 tablet Oral Every 12 hours 01/16/18 1833     01/13/18 2200  piperacillin-tazobactam (ZOSYN) IVPB 3.375 g  Status:  Discontinued     3.375 g 12.5 mL/hr over 240 Minutes Intravenous Every 8 hours 01/13/18 2109 01/16/18 1831   01/13/18 1845  cefTRIAXone (ROCEPHIN) 1 g in sodium chloride 0.9 % 100 mL IVPB     1 g 200 mL/hr over 30 Minutes Intravenous  Once 01/13/18 1839 01/13/18 1945   01/13/18 1845  azithromycin (ZITHROMAX) 500 mg in sodium chloride 0.9 % 250 mL IVPB     500 mg 250 mL/hr over 60 Minutes Intravenous  Once 01/13/18 1839 01/13/18 2005         Subjective: Patient seen and examined at bedside.  He is awake and answering questions.  No overnight fever, nausea or vomiting.  Complains of some chest discomfort when  he coughs.  Objective: Vitals:   01/15/18 2126 01/16/18 0326 01/16/18 0632 01/16/18 1356  BP: (!) 167/84  (!) 152/83 (!) 156/78  Pulse: (!) 110  96 74  Resp: 16  20 20   Temp: 99.2 F (37.3 C)  99.3 F (37.4 C) 99 F (37.2 C)  TempSrc: Oral   Oral  SpO2: 98%  97% 99%  Weight:  82.4 kg  (181 lb 9.6 oz)    Height:  5\' 10"  (1.778 m)      Intake/Output Summary (Last 24 hours) at 01/17/2018 1104 Last data filed at 01/17/2018 0509 Gross per 24 hour  Intake 480 ml  Output 2025 ml  Net -1545 ml   Filed Weights   01/14/18 0500 01/15/18 0402 01/16/18 0326  Weight: 81.7 kg (180 lb 1.9 oz) 83 kg (182 lb 15.7 oz) 82.4 kg (181 lb 9.6 oz)    Examination:  General exam: Appears calm and comfortable  Respiratory system: Bilateral decreased breath sound at bases Cardiovascular system: S1 & S2 heard, rate controlled  gastrointestinal system: Abdomen is nondistended, soft and nontender. Normal bowel sounds heard. Extremities: No cyanosis, clubbing, edema     Data Reviewed: I have personally reviewed following labs and imaging studies  CBC: Recent Labs  Lab 01/13/18 0535 01/13/18 0547 01/13/18 1851 01/13/18 2150 01/14/18 0347 01/16/18 0618  WBC 6.2  --  8.1 11.1* 9.6 6.9  NEUTROABS 4.0  --  6.7 8.5*  --   --   HGB 13.0 13.6 11.1* 11.7* 11.0* 11.2*  HCT 36.7* 40.0 31.5* 33.8* 31.2* 32.3*  MCV 90.0  --  88.7 90.6 89.1 88.3  PLT 261  --  177 220 174 184   Basic Metabolic Panel: Recent Labs  Lab 01/13/18 2150 01/14/18 0347 01/15/18 0335 01/16/18 0618 01/17/18 0609  NA 148* 150* 146* 140 137  K 3.5 3.4* 3.2* 3.4* 4.3  CL 119* 118* 114* 108 106  CO2 19* 23 19* 22 22  GLUCOSE 134* 175* 124* 181* 249*  BUN 13 13 11 6 8   CREATININE 0.68 0.74 0.90 0.80 0.96  CALCIUM 7.8* 8.0* 8.4* 8.6* 8.9  MG 1.1* 1.0* 1.9 1.4* 1.7  PHOS 2.0* 2.2* 2.1* 3.0  --    GFR: Estimated Creatinine Clearance: 88.7 mL/min (by C-G formula based on SCr of 0.96 mg/dL). Liver Function Tests: Recent Labs  Lab 01/13/18 0535 01/13/18 1616 01/13/18 2150  AST 23 31 25   ALT 12* 12* 14*  ALKPHOS 105 91 92  BILITOT 0.6 0.8 0.2*  PROT 8.4* 7.7 7.3  ALBUMIN 3.9 3.7 3.4*   No results for input(s): LIPASE, AMYLASE in the last 168 hours. No results for input(s): AMMONIA in the last 168  hours. Coagulation Profile: Recent Labs  Lab 01/13/18 2150  INR 1.00   Cardiac Enzymes: Recent Labs  Lab 01/13/18 2150 01/14/18 0347 01/14/18 0858  CKTOTAL 466*  --   --   TROPONINI 0.06*  0.06* 0.03* 0.03*   BNP (last 3 results) No results for input(s): PROBNP in the last 8760 hours. HbA1C: No results for input(s): HGBA1C in the last 72 hours. CBG: Recent Labs  Lab 01/16/18 1953 01/16/18 2350 01/17/18 0023 01/17/18 0402 01/17/18 0743  GLUCAP 372* 67 143* 183* 221*   Lipid Profile: No results for input(s): CHOL, HDL, LDLCALC, TRIG, CHOLHDL, LDLDIRECT in the last 72 hours. Thyroid Function Tests: No results for input(s): TSH, T4TOTAL, FREET4, T3FREE, THYROIDAB in the last 72 hours. Anemia Panel: No results for input(s): VITAMINB12, FOLATE, FERRITIN, TIBC,  IRON, RETICCTPCT in the last 72 hours. Sepsis Labs: Recent Labs  Lab 01/13/18 1907 01/13/18 2150 01/14/18 0110 01/14/18 0347 01/15/18 0335  PROCALCITON  --  <0.10  <0.10  --  <0.10 0.30  LATICACIDVEN 2.34* 1.5 1.9  --   --     Recent Results (from the past 240 hour(s))  Blood culture (routine x 2)     Status: None (Preliminary result)   Collection Time: 01/13/18  6:51 PM  Result Value Ref Range Status   Specimen Description   Final    BLOOD RIGHT HAND Blood Culture results may not be optimal due to an inadequate volume of blood received in culture bottles Performed at [facility], [address] [place], [place] [address]    Special Requests   Final    BOTTLES DRAWN AEROBIC ONLY Performed at [facility], [address] [place], [place] [address]    Culture   Final    NO GROWTH 2 DAYS Performed at [facility], [address] [place], [place] [address]    Report Status PENDING  Incomplete  Blood culture (routine x 2)     Status: None (Preliminary result)   Collection Time: 01/13/18  6:51 PM  Result Value Ref Range Status   Specimen Description BLOOD  LEFT ANTECUBITAL  Final   Special Requests   Final    BOTTLES DRAWN AEROBIC AND ANAEROBIC Blood Culture adequate volume   Culture   Final    NO GROWTH 2 DAYS Performed at [facility], [address], [place], [place] [address]    Report Status PENDING  Incomplete  Urine culture     Status: None   Collection Time: 01/13/18  9:50 PM  Result Value Ref Range Status   Specimen Description   Final    URINE, RANDOM Performed at [facility], [address], [place], [place] [address]    Special Requests   Final    NONE Performed at [facility], [address], [place], [place] [address]    Culture   Final    NO GROWTH Performed at [facility], [address], [place], [place] [address]    Report Status 01/15/2018 FINAL  Final  MRSA PCR Screening     Status: None   Collection Time: 01/13/18 11:08 PM  Result Value Ref Range Status   MRSA by PCR NEGATIVE NEGATIVE Final    Comment:        The GeneXpert MRSA Assay (FDA approved for NASAL specimens only), is one component of a comprehensive MRSA colonization surveillance program. It is not intended to diagnose MRSA infection nor to guide or monitor treatment for MRSA infections. Performed at [facility], [address], [place], [place] [address]          Radiology Studies: No results found.      Scheduled Meds: . amLODipine  5 mg Oral Daily  . amoxicillin-clavulanate  1 tablet Oral Q12H  . doxycycline  100 mg Oral Q12H  . folic acid  1 mg Oral Daily  . gabapentin  300 mg Oral TID  . heparin  5,000 Units Subcutaneous Q8H  . insulin aspart  0-9 Units Subcutaneous Q4H  . pantoprazole  20 mg Oral Daily  . saccharomyces boulardii  250 mg Oral BID  . sodium chloride flush  10-40 mL Intracatheter Q12H  . thiamine  100 mg Oral Daily   Continuous Infusions:  LOS: 4 days        Glade Lloyd, MD [facility] Pager [phone]  If  7PM-7AM, please contact night-coverage [email or web address] Password [facility] 01/17/2018, 11:04 AM

## 2018-01-18 LAB — GLUCOSE, CAPILLARY
GLUCOSE-CAPILLARY: 244 mg/dL — AB (ref 65–99)
GLUCOSE-CAPILLARY: 279 mg/dL — AB (ref 65–99)
Glucose-Capillary: 250 mg/dL — ABNORMAL HIGH (ref 65–99)
Glucose-Capillary: 297 mg/dL — ABNORMAL HIGH (ref 65–99)

## 2018-01-18 LAB — CBC WITH DIFFERENTIAL/PLATELET
Basophils Absolute: 0 10*3/uL (ref 0.0–0.1)
Basophils Relative: 0 %
Eosinophils Absolute: 0.4 10*3/uL (ref 0.0–0.7)
Eosinophils Relative: 6 %
HEMATOCRIT: 30.9 % — AB (ref 39.0–52.0)
Hemoglobin: 10.7 g/dL — ABNORMAL LOW (ref 13.0–17.0)
LYMPHS PCT: 20 %
Lymphs Abs: 1.3 10*3/uL (ref 0.7–4.0)
MCH: 31.2 pg (ref 26.0–34.0)
MCHC: 34.6 g/dL (ref 30.0–36.0)
MCV: 90.1 fL (ref 78.0–100.0)
MONO ABS: 0.6 10*3/uL (ref 0.1–1.0)
MONOS PCT: 9 %
NEUTROS ABS: 4.1 10*3/uL (ref 1.7–7.7)
Neutrophils Relative %: 65 %
Platelets: 216 10*3/uL (ref 150–400)
RBC: 3.43 MIL/uL — ABNORMAL LOW (ref 4.22–5.81)
RDW: 16.1 % — AB (ref 11.5–15.5)
WBC: 6.4 10*3/uL (ref 4.0–10.5)

## 2018-01-18 LAB — MAGNESIUM: Magnesium: 1.4 mg/dL — ABNORMAL LOW (ref 1.7–2.4)

## 2018-01-18 LAB — BASIC METABOLIC PANEL
Anion gap: 10 (ref 5–15)
BUN: 11 mg/dL (ref 6–20)
CALCIUM: 9 mg/dL (ref 8.9–10.3)
CHLORIDE: 106 mmol/L (ref 101–111)
CO2: 21 mmol/L — AB (ref 22–32)
Creatinine, Ser: 0.81 mg/dL (ref 0.61–1.24)
GFR calc Af Amer: >60 mL/min (ref >60)
GFR calc non Af Amer: >60 mL/min (ref >60)
GLUCOSE: 246 mg/dL — AB (ref 65–99)
Potassium: 4.3 mmol/L (ref 3.5–5.1)
Sodium: 137 mmol/L (ref 135–145)

## 2018-01-18 MED ORDER — INSULIN GLARGINE 100 UNIT/ML ~~LOC~~ SOLN
12.0000 [IU] | Freq: Every day | SUBCUTANEOUS | Status: DC
  Administered 2018-01-18: 12 [IU] via SUBCUTANEOUS
  Filled 2018-01-18 (×2): qty 0.12

## 2018-01-18 MED ORDER — MAGNESIUM SULFATE 2 GM/50ML IV SOLN
2.0000 g | Freq: Once | INTRAVENOUS | Status: AC
  Administered 2018-01-18: 2 g via INTRAVENOUS
  Filled 2018-01-18: qty 50

## 2018-01-18 NOTE — Progress Notes (Signed)
[facility] Progress Note     Subjective: CC- left earlobe abscess Patient reports persistent pain and swelling in left ear lobe. Using warm compresses 3-4x daily and taking antibiotics as prescribed, but currently no improvement. No drainage.  Objective: Vital signs in last 24 hours: Temp:  [99 F (37.2 C)-99.4 F (37.4 C)] 99 F (37.2 C) (04/25 0506) Pulse Rate:  [73-90] 73 (04/25 0506) Resp:  [15-19] 15 (04/25 0506) BP: (138-155)/(73-84) 138/73 (04/25 0506) SpO2:  [97 %-99 %] 97 % (04/25 0506) Last BM Date: 01/15/18  Intake/Output from previous day: 04/24 0701 - 04/25 0700 In: 240 [P.O.:240] Out: -  Intake/Output this shift: No intake/output data recorded.  PE: Gen:  Alert, NAD, pleasant HEENT: EOM's intact, pupils equal and round. Left earlobe fluctuant and TTP, no active drainage Pulm:  effort normal Ext:  No LE edema  Lab Results:  Recent Labs    01/16/18 0618 01/18/18 0543  WBC 6.9 6.4  HGB 11.2* 10.7*  HCT 32.3* 30.9*  PLT 184 216   BMET Recent Labs    01/17/18 0609 01/18/18 0543  NA 137 137  K 4.3 4.3  CL 106 106  CO2 22 21*  GLUCOSE 249* 246*  BUN 8 11  CREATININE 0.96 0.81  CALCIUM 8.9 9.0   PT/INR No results for input(s): LABPROT, INR in the last 72 hours. CMP     Component Value Date/Time   NA 137 01/18/2018 0543   K 4.3 01/18/2018 0543   CL 106 01/18/2018 0543   CO2 21 (L) 01/18/2018 0543   GLUCOSE 246 (H) 01/18/2018 0543   BUN 11 01/18/2018 0543   CREATININE 0.81 01/18/2018 0543   CALCIUM 9.0 01/18/2018 0543   PROT 7.3 01/13/2018 2150   ALBUMIN 3.4 (L) 01/13/2018 2150   AST 25 01/13/2018 2150   ALT 14 (L) 01/13/2018 2150   ALKPHOS 92 01/13/2018 2150   BILITOT 0.2 (L) 01/13/2018 2150   GFRNONAA >60 01/18/2018 0543   GFRAA >60 01/18/2018 0543   Lipase  No results found for: LIPASE     Studies/Results: No results found.  Anti-infectives: Anti-infectives (From admission, onward)   Start     Dose/Rate  Route Frequency Ordered Stop   01/17/18 1000  doxycycline (VIBRA-TABS) tablet 100 mg     100 mg Oral Every 12 hours 01/17/18 0912     01/16/18 2200  amoxicillin-clavulanate (AUGMENTIN) 875-125 MG per tablet 1 tablet     1 tablet Oral Every 12 hours 01/16/18 1833     01/13/18 2200  piperacillin-tazobactam (ZOSYN) IVPB 3.375 g  Status:  Discontinued     3.375 g 12.5 mL/hr over 240 Minutes Intravenous Every 8 hours 01/13/18 2109 01/16/18 1831   01/13/18 1845  cefTRIAXone (ROCEPHIN) 1 g in sodium chloride 0.9 % 100 mL IVPB     1 g 200 mL/hr over 30 Minutes Intravenous  Once 01/13/18 1839 01/13/18 1945   01/13/18 1845  azithromycin (ZITHROMAX) 500 mg in sodium chloride 0.9 % 250 mL IVPB     500 mg 250 mL/hr over 60 Minutes Intravenous  Once 01/13/18 1839 01/13/18 2005       Assessment/Plan Alcohol abuse DM Aspiration pneumonia - on augmentin, per primary Homeless  Left ear lobe abscess  FEN: carb Mod diet ID:   Azithromycin/Rocephin 4/20 x 1 dose; Zosyn 4/21>>4/23; Augmentin 4/23>>, doxycycline 4/24>> DVT:  Heparin Follow up:  TBD  Plan:  Continue heat to site and oral antibiotics. Will discuss I&D with MD.  LOS: 5 days    Franne Forts , [facility] 01/18/2018, 11:03 AM Pager: [phone] Consults: [phone] Mon-Fri 7:00 am-4:30 pm Sat-Sun 7:00 am-11:30 am

## 2018-01-18 NOTE — Progress Notes (Signed)
Patient ID: Michael [name], male   DOB: 03/10/1961, 57 y.o.   MRN: [id number]  PROGRESS NOTE    Michael Prudehomas Finigan  XLK:[id number] DOB: 01/28/1961 DOA: 01/13/2018 PCP: System, Pcp Not In   Brief Narrative:  57 year old homeless man with history of alcohol abuse, diabetes mellitus was admitted on 01/13/2018 after he was found outside in the street acutely intoxicated with alcohol.  In the ED, he had a witnessed seizure.  He was intubated for airway protection and started on intravenous Zosyn for aspiration.  He was under the care of critical care team.  He self extubated on 01/14/2018.  His care it was transferred to [facility] on 01/15/2018.  He was transferred out of ICU.  Psychiatry was also consulted who concluded that patient did not have evidence of imminent risk to self or others at present.   Assessment & Plan:   Principal Problem:   Alcohol use disorder, severe, dependence (HCC) Active Problems:   Acute respiratory failure with hypoxia and hypercapnia (HCC)   Pressure injury of skin  Acute hypoxic and hypercapnic respiratory failure -Status post intubation and self extubation on 01/14/2018 -Currently on room air.  Respiratory status stable  Aspiration pneumonia -Initially started on Zosyn.  Antibiotics were switched to oral Augmentin on 01/16/2018 -Cultures negative so far  Acute encephalopathy secondary to alcohol intoxication and seizure thought to be due to delirium tremens -Was on Precedex drip while on ventilator.  Currently off Precedex drip -EEG was unremarkable -Mental status has improved -Psychiatry evaluation appreciated: Psychiatry has signed off.  Continue Neurontin as per psychiatry recommendations -Fall precautions -continue folate and thiamine  Left earlobe abscess -General surgery following: We will follow-up with general surgery regarding the need for I&D versus only antibiotic therapy -Continue Augmentin and doxycycline  Hypokalemia -Resolved.  Repeat a.m.  Labs  Hypomagnesemia -Replace.  Repeat a.m. labs  Diabetes mellitus type 2 with hyperglycemia -Hemoglobin A1c 9.8 -Oral metformin on hold -Still hyperglycemic.  Increase Lantus to 12 units at bedtime along with sliding scale coverage.  Hypertension -Monitor blood pressure.  Continue Norvasc  Homelessness -Child psychotherapistocial worker following  DVT prophylaxis: Heparin Code Status: Full Family Communication: None at bedside Disposition Plan: Probable discharge to shelter once cleared by general surgery  Consultants: Critical care/psychiatry/general surgery  Procedures: Intubation and extubation  Antimicrobials:  Anti-infectives (From admission, onward)   Start     Dose/Rate Route Frequency Ordered Stop   01/17/18 1000  doxycycline (VIBRA-TABS) tablet 100 mg     100 mg Oral Every 12 hours 01/17/18 0912     01/16/18 2200  amoxicillin-clavulanate (AUGMENTIN) 875-125 MG per tablet 1 tablet     1 tablet Oral Every 12 hours 01/16/18 1833     01/13/18 2200  piperacillin-tazobactam (ZOSYN) IVPB 3.375 g  Status:  Discontinued     3.375 g 12.5 mL/hr over 240 Minutes Intravenous Every 8 hours 01/13/18 2109 01/16/18 1831   01/13/18 1845  cefTRIAXone (ROCEPHIN) 1 g in sodium chloride 0.9 % 100 mL IVPB     1 g 200 mL/hr over 30 Minutes Intravenous  Once 01/13/18 1839 01/13/18 1945   01/13/18 1845  azithromycin (ZITHROMAX) 500 mg in sodium chloride 0.9 % 250 mL IVPB     500 mg 250 mL/hr over 60 Minutes Intravenous  Once 01/13/18 1839 01/13/18 2005       Subjective: Patient seen and examined at bedside.  No overnight fever, nausea or vomiting.  Still has some intermittent cough.  Objective: Vitals:   01/16/18 1356 01/17/18  1419 01/17/18 2040 01/18/18 0506  BP: (!) 156/78 (!) 153/81 (!) 155/84 138/73  Pulse: 74 90 85 73  Resp: 20 18 19 15   Temp: 99 F (37.2 C) 99.4 F (37.4 C) 99.1 F (37.3 C) 99 F (37.2 C)  TempSrc: Oral Oral    SpO2: 99% 98% 99% 97%  Weight:      Height:       No  intake or output data in the 24 hours ending 01/18/18 1346 Filed Weights   01/14/18 0500 01/15/18 0402 01/16/18 0326  Weight: 81.7 kg (180 lb 1.9 oz) 83 kg (182 lb 15.7 oz) 82.4 kg (181 lb 9.6 oz)    Examination:  General exam: Appears calm and comfortable.  No distress Respiratory system: Bilateral decreased breath sounds at bases  cardiovascular system: S1-S2 heard, rate controlled  gastrointestinal system: Abdomen is nondistended, soft and nontender. Normal bowel sounds heard. Extremities: No cyanosis, clubbing, edema     Data Reviewed: I have personally reviewed following labs and imaging studies  CBC: Recent Labs  Lab 01/13/18 0535  01/13/18 1851 01/13/18 2150 01/14/18 0347 01/16/18 0618 01/18/18 0543  WBC 6.2  --  8.1 11.1* 9.6 6.9 6.4  NEUTROABS 4.0  --  6.7 8.5*  --   --  4.1  HGB 13.0   < > 11.1* 11.7* 11.0* 11.2* 10.7*  HCT 36.7*   < > 31.5* 33.8* 31.2* 32.3* 30.9*  MCV 90.0  --  88.7 90.6 89.1 88.3 90.1  PLT 261  --  177 220 174 184 216   < > = values in this interval not displayed.   Basic Metabolic Panel: Recent Labs  Lab 01/13/18 2150 01/14/18 0347 01/15/18 0335 01/16/18 0618 01/17/18 0609 01/18/18 0543  NA 148* 150* 146* 140 137 137  K 3.5 3.4* 3.2* 3.4* 4.3 4.3  CL 119* 118* 114* 108 106 106  CO2 19* 23 19* 22 22 21*  GLUCOSE 134* 175* 124* 181* 249* 246*  BUN 13 13 11 6 8 11   CREATININE 0.68 0.74 0.90 0.80 0.96 0.81  CALCIUM 7.8* 8.0* 8.4* 8.6* 8.9 9.0  MG 1.1* 1.0* 1.9 1.4* 1.7 1.4*  PHOS 2.0* 2.2* 2.1* 3.0  --   --    GFR: Estimated Creatinine Clearance: 105.1 mL/min (by C-G formula based on SCr of 0.81 mg/dL). Liver Function Tests: Recent Labs  Lab 01/13/18 0535 01/13/18 1616 01/13/18 2150  AST 23 31 25   ALT 12* 12* 14*  ALKPHOS 105 91 92  BILITOT 0.6 0.8 0.2*  PROT 8.4* 7.7 7.3  ALBUMIN 3.9 3.7 3.4*   No results for input(s): LIPASE, AMYLASE in the last 168 hours. No results for input(s): AMMONIA in the last 168  hours. Coagulation Profile: Recent Labs  Lab 01/13/18 2150  INR 1.00   Cardiac Enzymes: Recent Labs  Lab 01/13/18 2150 01/14/18 0347 01/14/18 0858  CKTOTAL 466*  --   --   TROPONINI 0.06*  0.06* 0.03* 0.03*   BNP (last 3 results) No results for input(s): PROBNP in the last 8760 hours. HbA1C: No results for input(s): HGBA1C in the last 72 hours. CBG: Recent Labs  Lab 01/17/18 1209 01/17/18 1619 01/17/18 2041 01/18/18 0803 01/18/18 1152  GLUCAP 236* 209* 246* 250* 244*   Lipid Profile: No results for input(s): CHOL, HDL, LDLCALC, TRIG, CHOLHDL, LDLDIRECT in the last 72 hours. Thyroid Function Tests: No results for input(s): TSH, T4TOTAL, FREET4, T3FREE, THYROIDAB in the last 72 hours. Anemia Panel: No results for input(s): VITAMINB12, FOLATE,  FERRITIN, TIBC, IRON, RETICCTPCT in the last 72 hours. Sepsis Labs: Recent Labs  Lab 01/13/18 1907 01/13/18 2150 01/14/18 0110 01/14/18 0347 01/15/18 0335  PROCALCITON  --  <0.10  <0.10  --  <0.10 0.30  LATICACIDVEN 2.34* 1.5 1.9  --   --     Recent Results (from the past 240 hour(s))  Blood culture (routine x 2)     Status: None (Preliminary result)   Collection Time: 01/13/18  6:51 PM  Result Value Ref Range Status   Specimen Description   Final    BLOOD RIGHT HAND Blood Culture results may not be optimal due to an inadequate volume of blood received in culture bottles Performed at [facility], [address], [place], [place] [address]    Special Requests   Final    BOTTLES DRAWN AEROBIC ONLY Performed at [facility], [address], [place], [place] [address]    Culture   Final    NO GROWTH 3 DAYS Performed at [facility], [address] [place], [place] [address]    Report Status PENDING  Incomplete  Blood culture (routine x 2)     Status: None (Preliminary result)   Collection Time: 01/13/18  6:51 PM  Result Value Ref Range Status   Specimen Description  BLOOD LEFT ANTECUBITAL  Final   Special Requests   Final    BOTTLES DRAWN AEROBIC AND ANAEROBIC Blood Culture adequate volume   Culture   Final    NO GROWTH 3 DAYS Performed at [facility], [address], [place], [place] [address]    Report Status PENDING  Incomplete  Urine culture     Status: None   Collection Time: 01/13/18  9:50 PM  Result Value Ref Range Status   Specimen Description   Final    URINE, RANDOM Performed at [facility], [address], [place], [place] [address]    Special Requests   Final    NONE Performed at [facility], [address] [place], [place] [address]    Culture   Final    NO GROWTH Performed at [facility], [address] [place], [place] [address]    Report Status 01/15/2018 FINAL  Final  MRSA PCR Screening     Status: None   Collection Time: 01/13/18 11:08 PM  Result Value Ref Range Status   MRSA by PCR NEGATIVE NEGATIVE Final    Comment:        The GeneXpert MRSA Assay (FDA approved for NASAL specimens only), is one component of a comprehensive MRSA colonization surveillance program. It is not intended to diagnose MRSA infection nor to guide or monitor treatment for MRSA infections. Performed at [facility], [address] [place], [place] [address]          Radiology Studies: No results found.      Scheduled Meds: . amLODipine  5 mg Oral Daily  . amoxicillin-clavulanate  1 tablet Oral Q12H  . doxycycline  100 mg Oral Q12H  . folic acid  1 mg Oral Daily  . gabapentin  300 mg Oral TID  . heparin  5,000 Units Subcutaneous Q8H  . insulin aspart  0-5 Units Subcutaneous QHS  . insulin aspart  0-9 Units Subcutaneous TID WC  . insulin glargine  6 Units Subcutaneous QHS  . multivitamin with minerals  1 tablet Oral Q24H  . pantoprazole  20 mg Oral Daily  .  saccharomyces boulardii  250 mg Oral BID  . sodium chloride flush  10-40 mL Intracatheter  Q12H  . thiamine  100 mg Oral Daily   Continuous Infusions:   LOS: 5 days        Glade Lloyd, MD [facility] Pager [phone]  If 7PM-7AM, please contact night-coverage [email or web address] Password [email or web address] 01/18/2018, 1:46 PM

## 2018-01-18 NOTE — Progress Notes (Signed)
LCSW spoke with patients care coordinator, Junius RoadsLisa Hinson.  Care coordinator reports that guardian would like for [facility] to [facility]. Patient is agreeable.   LCSW needs consent from guardian to make referral and send patient recs. Care coordinator aware and agrees. Care coordinator stated she will have [facility] contact LCSW. LCSW left message for guardian.   Care coordinator understands patient cannot be held at the hospital [facility] if he is medically stable for discharge.   Beulah GandyBernette Rhiley Tarver, LSCW [facility] [phone]

## 2018-01-18 NOTE — Progress Notes (Signed)
LCSW initiated eferral to [facility].  LCSW confirmed that [facility] ill [facility]   LCSW updated patient.  LCSW will continue to follow for disposition.   Michael GandyBernette Aamari Strawderman, LSCW [facility] [phone]

## 2018-01-19 DIAGNOSIS — H60392 Other infective otitis externa, left ear: Secondary | ICD-10-CM

## 2018-01-19 LAB — MAGNESIUM: MAGNESIUM: 1.5 mg/dL — AB (ref 1.7–2.4)

## 2018-01-19 LAB — GLUCOSE, CAPILLARY
GLUCOSE-CAPILLARY: 257 mg/dL — AB (ref 65–99)
GLUCOSE-CAPILLARY: 235 mg/dL — AB (ref 65–99)
Glucose-Capillary: 159 mg/dL — ABNORMAL HIGH (ref 65–99)
Glucose-Capillary: 271 mg/dL — ABNORMAL HIGH (ref 65–99)

## 2018-01-19 LAB — CBC WITH DIFFERENTIAL/PLATELET
BASOS ABS: 0 10*3/uL (ref 0.0–0.1)
BASOS PCT: 0 %
Eosinophils Absolute: 0.3 10*3/uL (ref 0.0–0.7)
Eosinophils Relative: 6 %
HEMATOCRIT: 31.7 % — AB (ref 39.0–52.0)
HEMOGLOBIN: 11 g/dL — AB (ref 13.0–17.0)
LYMPHS PCT: 27 %
Lymphs Abs: 1.5 10*3/uL (ref 0.7–4.0)
MCH: 31.1 pg (ref 26.0–34.0)
MCHC: 34.7 g/dL (ref 30.0–36.0)
MCV: 89.5 fL (ref 78.0–100.0)
Monocytes Absolute: 0.4 10*3/uL (ref 0.1–1.0)
Monocytes Relative: 7 %
NEUTROS ABS: 3.3 10*3/uL (ref 1.7–7.7)
NEUTROS PCT: 60 %
Platelets: 257 10*3/uL (ref 150–400)
RBC: 3.54 MIL/uL — ABNORMAL LOW (ref 4.22–5.81)
RDW: 16.1 % — ABNORMAL HIGH (ref 11.5–15.5)
WBC: 5.5 10*3/uL (ref 4.0–10.5)

## 2018-01-19 LAB — CULTURE, BLOOD (ROUTINE X 2)
CULTURE: NO GROWTH
Culture: NO GROWTH
SPECIAL REQUESTS: ADEQUATE

## 2018-01-19 LAB — BASIC METABOLIC PANEL
ANION GAP: 10 (ref 5–15)
BUN: 12 mg/dL (ref 6–20)
CHLORIDE: 107 mmol/L (ref 101–111)
CO2: 22 mmol/L (ref 22–32)
Calcium: 9.4 mg/dL (ref 8.9–10.3)
Creatinine, Ser: 0.78 mg/dL (ref 0.61–1.24)
GFR calc Af Amer: >60 mL/min (ref >60)
GFR calc non Af Amer: >60 mL/min (ref >60)
GLUCOSE: 174 mg/dL — AB (ref 65–99)
POTASSIUM: 4 mmol/L (ref 3.5–5.1)
SODIUM: 139 mmol/L (ref 135–145)

## 2018-01-19 MED ORDER — MAGNESIUM SULFATE 2 GM/50ML IV SOLN
2.0000 g | Freq: Once | INTRAVENOUS | Status: AC
  Administered 2018-01-19: 2 g via INTRAVENOUS
  Filled 2018-01-19: qty 50

## 2018-01-19 MED ORDER — PENTAFLUOROPROP-TETRAFLUOROETH EX AERO
INHALATION_SPRAY | Freq: Once | CUTANEOUS | Status: DC
  Filled 2018-01-19: qty 103.5

## 2018-01-19 MED ORDER — INSULIN GLARGINE 100 UNIT/ML ~~LOC~~ SOLN
15.0000 [IU] | Freq: Every day | SUBCUTANEOUS | Status: DC
  Administered 2018-01-19: 15 [IU] via SUBCUTANEOUS
  Filled 2018-01-19: qty 0.15

## 2018-01-19 NOTE — Progress Notes (Signed)
Patient ID: Michael [name], male   DOB: March 24, 1961, 57 y.o.   MRN: [id number]  PROGRESS NOTE    Michael [name]  [id number]:[id number] DOB: 1960/10/31 DOA: 01/13/2018 PCP: System, Pcp Not In   Brief Narrative:  57 year old homeless man with history of alcohol abuse, diabetes mellitus was admitted on 01/13/2018 after he was found outside in the street acutely intoxicated with alcohol.  In the ED, he had a witnessed seizure.  He was intubated for airway protection and started on intravenous Zosyn for aspiration.  He was under the care of critical care team.  He self extubated on 01/14/2018.  His care it was transferred to [facility] on 01/15/2018.  He was transferred out of ICU.  Psychiatry was also consulted who concluded that patient did not have evidence of imminent risk to self or others at present.   Assessment & Plan:   Principal Problem:   Alcohol use disorder, severe, dependence (HCC) Active Problems:   Acute respiratory failure with hypoxia and hypercapnia (HCC)   Pressure injury of skin  Acute hypoxic and hypercapnic respiratory failure -Status post intubation and self extubation on 01/14/2018 -Currently on room air.  Respiratory status stable  Aspiration pneumonia -Initially started on Zosyn.  Antibiotics were switched to oral Augmentin on 01/16/2018 -Cultures negative so far  Acute encephalopathy secondary to alcohol intoxication and seizure thought to be due to delirium tremens -Was on Precedex drip while on ventilator.  Currently off Precedex drip -EEG was unremarkable -Mental status has improved -Psychiatry evaluation appreciated: Psychiatry has signed off.  Continue Neurontin as per psychiatry recommendations -Fall precautions -continue folate and thiamine  Left earlobe abscess -General surgery following: General surgery is recommending ultrasound-guided aspiration -Continue Augmentin and doxycycline  Hypokalemia -Resolved.  Repeat a.m. Labs  Hypomagnesemia -Replace.  Repeat  a.m. labs  Diabetes mellitus type 2 with hyperglycemia -Hemoglobin A1c 9.8 -Oral metformin on hold -Still hyperglycemic.  Increase Lantus to 15 units at bedtime along with sliding scale coverage.  Hypertension -Monitor blood pressure.  Continue Norvasc  Homelessness -Child psychotherapist following  DVT prophylaxis: Heparin Code Status: Full Family Communication: None at bedside Disposition Plan: Probable discharge to shelter once cleared by general surgery  Consultants: Critical care/psychiatry/general surgery  Procedures: Intubation and extubation  Antimicrobials:  Anti-infectives (From admission, onward)   Start     Dose/Rate Route Frequency Ordered Stop   01/17/18 1000  doxycycline (VIBRA-TABS) tablet 100 mg     100 mg Oral Every 12 hours 01/17/18 0912     01/16/18 2200  amoxicillin-clavulanate (AUGMENTIN) 875-125 MG per tablet 1 tablet     1 tablet Oral Every 12 hours 01/16/18 1833     01/13/18 2200  piperacillin-tazobactam (ZOSYN) IVPB 3.375 g  Status:  Discontinued     3.375 g 12.5 mL/hr over 240 Minutes Intravenous Every 8 hours 01/13/18 2109 01/16/18 1831   01/13/18 1845  cefTRIAXone (ROCEPHIN) 1 g in sodium chloride 0.9 % 100 mL IVPB     1 g 200 mL/hr over 30 Minutes Intravenous  Once 01/13/18 1839 01/13/18 1945   01/13/18 1845  azithromycin (ZITHROMAX) 500 mg in sodium chloride 0.9 % 250 mL IVPB     500 mg 250 mL/hr over 60 Minutes Intravenous  Once 01/13/18 1839 01/13/18 2005        Subjective: Patient seen and examined at bedside.  No overnight fever, nausea or vomiting.  No new complaints  Objective: Vitals:   01/18/18 0506 01/18/18 1519 01/18/18 2106 01/19/18 0433  BP: 138/73 135/82 Marland Kitchen)  156/64 (!) 165/90  Pulse: 73 77 76 82  Resp: 15 12 20 16   Temp: 99 F (37.2 C) 98.6 F (37 C) 98.6 F (37 C) 98.8 F (37.1 C)  TempSrc:  Oral Oral Oral  SpO2: 97% 96% 99% 98%  Weight:      Height:        Intake/Output Summary (Last 24 hours) at 01/19/2018 1308 Last  data filed at 01/19/2018 0914 Gross per 24 hour  Intake 240 ml  Output -  Net 240 ml   Filed Weights   01/14/18 0500 01/15/18 0402 01/16/18 0326  Weight: 81.7 kg (180 lb 1.9 oz) 83 kg (182 lb 15.7 oz) 82.4 kg (181 lb 9.6 oz)    Examination:  General exam: No acute distress.  Left earlobe fluctuance present with mild tenderness Respiratory system: Bilateral decreased breath sounds at bases. cardiovascular system: S1-S2 heard, rate controlled  gastrointestinal system: Abdomen is nondistended, soft and nontender. Normal bowel sounds heard. Extremities: No cyanosis, clubbing, edema     Data Reviewed: I have personally reviewed following labs and imaging studies  CBC: Recent Labs  Lab 01/13/18 0535  01/13/18 1851 01/13/18 2150 01/14/18 0347 01/16/18 0618 01/18/18 0543 01/19/18 0541  WBC 6.2  --  8.1 11.1* 9.6 6.9 6.4 5.5  NEUTROABS 4.0  --  6.7 8.5*  --   --  4.1 3.3  HGB 13.0   < > 11.1* 11.7* 11.0* 11.2* 10.7* 11.0*  HCT 36.7*   < > 31.5* 33.8* 31.2* 32.3* 30.9* 31.7*  MCV 90.0  --  88.7 90.6 89.1 88.3 90.1 89.5  PLT 261  --  177 220 174 184 216 257   < > = values in this interval not displayed.   Basic Metabolic Panel: Recent Labs  Lab 01/13/18 2150 01/14/18 0347 01/15/18 0335 01/16/18 0618 01/17/18 0609 01/18/18 0543 01/19/18 0541  NA 148* 150* 146* 140 137 137 139  K 3.5 3.4* 3.2* 3.4* 4.3 4.3 4.0  CL 119* 118* 114* 108 106 106 107  CO2 19* 23 19* 22 22 21* 22  GLUCOSE 134* 175* 124* 181* 249* 246* 174*  BUN 13 13 11 6 8 11 12   CREATININE 0.68 0.74 0.90 0.80 0.96 0.81 0.78  CALCIUM 7.8* 8.0* 8.4* 8.6* 8.9 9.0 9.4  MG 1.1* 1.0* 1.9 1.4* 1.7 1.4* 1.5*  PHOS 2.0* 2.2* 2.1* 3.0  --   --   --    GFR: Estimated Creatinine Clearance: 106.5 mL/min (by C-G formula based on SCr of 0.78 mg/dL). Liver Function Tests: Recent Labs  Lab 01/13/18 0535 01/13/18 1616 01/13/18 2150  AST 23 31 25   ALT 12* 12* 14*  ALKPHOS 105 91 92  BILITOT 0.6 0.8 0.2*  PROT 8.4*  7.7 7.3  ALBUMIN 3.9 3.7 3.4*   No results for input(s): LIPASE, AMYLASE in the last 168 hours. No results for input(s): AMMONIA in the last 168 hours. Coagulation Profile: Recent Labs  Lab 01/13/18 2150  INR 1.00   Cardiac Enzymes: Recent Labs  Lab 01/13/18 2150 01/14/18 0347 01/14/18 0858  CKTOTAL 466*  --   --   TROPONINI 0.06*  0.06* 0.03* 0.03*   BNP (last 3 results) No results for input(s): PROBNP in the last 8760 hours. HbA1C: No results for input(s): HGBA1C in the last 72 hours. CBG: Recent Labs  Lab 01/18/18 1152 01/18/18 1643 01/18/18 2145 01/19/18 0725 01/19/18 1138  GLUCAP 244* 297* 279* 159* 257*   Lipid Profile: No results for input(s): CHOL, HDL,  LDLCALC, TRIG, CHOLHDL, LDLDIRECT in the last 72 hours. Thyroid Function Tests: No results for input(s): TSH, T4TOTAL, FREET4, T3FREE, THYROIDAB in the last 72 hours. Anemia Panel: No results for input(s): VITAMINB12, FOLATE, FERRITIN, TIBC, IRON, RETICCTPCT in the last 72 hours. Sepsis Labs: Recent Labs  Lab 01/13/18 1907 01/13/18 2150 01/14/18 0110 01/14/18 0347 01/15/18 0335  PROCALCITON  --  <0.10  <0.10  --  <0.10 0.30  LATICACIDVEN 2.34* 1.5 1.9  --   --     Recent Results (from the past 240 hour(s))  Blood culture (routine x 2)     Status: None   Collection Time: 01/13/18  6:51 PM  Result Value Ref Range Status   Specimen Description   Final    BLOOD RIGHT HAND Blood Culture results may not be optimal due to an inadequate volume of blood received in culture bottles Performed at [facility], [address] [place], [place] [address]    Special Requests   Final    BOTTLES DRAWN AEROBIC ONLY Performed at [facility], [address] [place], [place] [address]    Culture   Final    NO GROWTH 5 DAYS Performed at [facility], [address] [place], [place] [address]    Report Status 01/19/2018 FINAL  Final  Blood culture (routine x 2)      Status: None   Collection Time: 01/13/18  6:51 PM  Result Value Ref Range Status   Specimen Description BLOOD LEFT ANTECUBITAL  Final   Special Requests   Final    BOTTLES DRAWN AEROBIC AND ANAEROBIC Blood Culture adequate volume   Culture   Final    NO GROWTH 5 DAYS Performed at [facility], [address] [place], [place] [address]    Report Status 01/19/2018 FINAL  Final  Urine culture     Status: None   Collection Time: 01/13/18  9:50 PM  Result Value Ref Range Status   Specimen Description   Final    URINE, RANDOM Performed at [facility], [address] [place], [place] [address]    Special Requests   Final    NONE Performed at [facility], [address] [place], [place] [address]    Culture   Final    NO GROWTH Performed at [facility], [address], [place], [place] [address]    Report Status 01/15/2018 FINAL  Final  MRSA PCR Screening     Status: None   Collection Time: 01/13/18 11:08 PM  Result Value Ref Range Status   MRSA by PCR NEGATIVE NEGATIVE Final    Comment:        The GeneXpert MRSA Assay (FDA approved for NASAL specimens only), is one component of a comprehensive MRSA colonization surveillance program. It is not intended to diagnose MRSA infection nor to guide or monitor treatment for MRSA infections. Performed at [facility], [address] [place], [place] [address]          Radiology Studies: No results found.      Scheduled Meds: . amLODipine  5 mg Oral Daily  . amoxicillin-clavulanate  1 tablet Oral Q12H  . doxycycline  100 mg Oral Q12H  . folic acid  1 mg Oral Daily  . gabapentin  300 mg Oral TID  . heparin  5,000 Units Subcutaneous Q8H  . insulin aspart  0-5 Units Subcutaneous QHS  . insulin aspart  0-9  Units Subcutaneous TID WC  . insulin glargine  12 Units Subcutaneous QHS  . multivitamin with minerals  1 tablet Oral Q24H  . pantoprazole  20  mg Oral Daily  . pentafluoroprop-tetrafluoroeth   Topical Once  . saccharomyces boulardii  250 mg Oral BID  . sodium chloride flush  10-40 mL Intracatheter Q12H  . thiamine  100 mg Oral Daily   Continuous Infusions:   LOS: 6 days        Glade LloydKshitiz Deavon Podgorski, MD [facility] Pager [phone]  If 7PM-7AM, please contact night-coverage [email or web address] Password [email or web address] 01/19/2018, 1:08 PM

## 2018-01-19 NOTE — Progress Notes (Addendum)
[facility] Progress Note     Subjective: CC- left earlobe pain Patient reports no improvement. Continues to have pain in left ear lobe. He has been using warm compresses. No drainage or decrease in earlobe edema.  Objective: Vital signs in last 24 hours: Temp:  [98.6 F (37 C)-98.8 F (37.1 C)] 98.8 F (37.1 C) (04/26 0433) Pulse Rate:  [76-82] 82 (04/26 0433) Resp:  [12-20] 16 (04/26 0433) BP: (135-165)/(64-90) 165/90 (04/26 0433) SpO2:  [96 %-99 %] 98 % (04/26 0433) Last BM Date: 01/15/18  Intake/Output from previous day: No intake/output data recorded. Intake/Output this shift: No intake/output data recorded.  PE: Gen:  Alert, NAD, pleasant HEENT: EOM's intact, pupils equal and round. Left earlobe fluctuant and TTP, no active drainage Pulm:  effort normal Ext:  No LE edema  Lab Results:  Recent Labs    01/18/18 0543 01/19/18 0541  WBC 6.4 5.5  HGB 10.7* 11.0*  HCT 30.9* 31.7*  PLT 216 257   BMET Recent Labs    01/18/18 0543 01/19/18 0541  NA 137 139  K 4.3 4.0  CL 106 107  CO2 21* 22  GLUCOSE 246* 174*  BUN 11 12  CREATININE 0.81 0.78  CALCIUM 9.0 9.4   PT/INR No results for input(s): LABPROT, INR in the last 72 hours. CMP     Component Value Date/Time   NA 139 01/19/2018 0541   K 4.0 01/19/2018 0541   CL 107 01/19/2018 0541   CO2 22 01/19/2018 0541   GLUCOSE 174 (H) 01/19/2018 0541   BUN 12 01/19/2018 0541   CREATININE 0.78 01/19/2018 0541   CALCIUM 9.4 01/19/2018 0541   PROT 7.3 01/13/2018 2150   ALBUMIN 3.4 (L) 01/13/2018 2150   AST 25 01/13/2018 2150   ALT 14 (L) 01/13/2018 2150   ALKPHOS 92 01/13/2018 2150   BILITOT 0.2 (L) 01/13/2018 2150   GFRNONAA >60 01/19/2018 0541   GFRAA >60 01/19/2018 0541   Lipase  No results found for: LIPASE     Studies/Results: No results found.  Anti-infectives: Anti-infectives (From admission, onward)   Start     Dose/Rate Route Frequency Ordered Stop   01/17/18 1000   doxycycline (VIBRA-TABS) tablet 100 mg     100 mg Oral Every 12 hours 01/17/18 0912     01/16/18 2200  amoxicillin-clavulanate (AUGMENTIN) 875-125 MG per tablet 1 tablet     1 tablet Oral Every 12 hours 01/16/18 1833     01/13/18 2200  piperacillin-tazobactam (ZOSYN) IVPB 3.375 g  Status:  Discontinued     3.375 g 12.5 mL/hr over 240 Minutes Intravenous Every 8 hours 01/13/18 2109 01/16/18 1831   01/13/18 1845  cefTRIAXone (ROCEPHIN) 1 g in sodium chloride 0.9 % 100 mL IVPB     1 g 200 mL/hr over 30 Minutes Intravenous  Once 01/13/18 1839 01/13/18 1945   01/13/18 1845  azithromycin (ZITHROMAX) 500 mg in sodium chloride 0.9 % 250 mL IVPB     500 mg 250 mL/hr over 60 Minutes Intravenous  Once 01/13/18 1839 01/13/18 2005       Assessment/Plan Alcohol abuse DM Aspiration pneumonia - on augmentin, per primary Homeless  Left ear lobe fluctuance, ?abscess   FEN: carb Mod diet ID: Azithromycin/Rocephin 4/20 x 1 dose; Zosyn 4/21>>4/23; Augmentin 4/23>>, doxycycline 4/24>> DVT: Heparin Follow up: TBD  Plan: Bedside aspiration of left earlobe performed, will send fluid for culture. Continue antibiotics and warm compresses.  Procedure: Left earlobe aspiration The procedure, risks and complications  have been discussed in detail (including, but not limited to pain, infection, bleeding, recurrence of fluid collection) with the patient, and the patient wishes to proceed with the procedure. The skin was sterilely prepped over the affected area in the usual fashion with betadine and chloraprep. Using an 18 gauge needle about 1cc purulent fluid was aspirated from the left earlobe. Needle became clogged with what appeared to be scar tissue, so the ear lobe was milked and copious more purulent fluid was removed. Once drainage stopped topic antibiotic ointment was applied. Hemostasis was reached. The patient was observed until stable. There were no complications, and the patient tolerated the  procedure well. Fluid will be sent for culture.    LOS: 6 days    Franne Forts , [facility] 01/19/2018, 7:33 AM Pager: [phone] Consults: [phone] Mon-Fri 7:00 am-4:30 pm Sat-Sun 7:00 am-11:30 am

## 2018-01-20 LAB — BASIC METABOLIC PANEL
ANION GAP: 7 (ref 5–15)
BUN: 17 mg/dL (ref 6–20)
CALCIUM: 9.3 mg/dL (ref 8.9–10.3)
CO2: 21 mmol/L — ABNORMAL LOW (ref 22–32)
Chloride: 107 mmol/L (ref 101–111)
Creatinine, Ser: 0.91 mg/dL (ref 0.61–1.24)
GFR calc Af Amer: >60 mL/min (ref >60)
GFR calc non Af Amer: >60 mL/min (ref >60)
Glucose, Bld: 270 mg/dL — ABNORMAL HIGH (ref 65–99)
Potassium: 4.6 mmol/L (ref 3.5–5.1)
Sodium: 135 mmol/L (ref 135–145)

## 2018-01-20 LAB — CBC WITH DIFFERENTIAL/PLATELET
BASOS ABS: 0 10*3/uL (ref 0.0–0.1)
Basophils Relative: 0 %
Eosinophils Absolute: 0.3 10*3/uL (ref 0.0–0.7)
Eosinophils Relative: 5 %
HCT: 31.6 % — ABNORMAL LOW (ref 39.0–52.0)
Hemoglobin: 11 g/dL — ABNORMAL LOW (ref 13.0–17.0)
LYMPHS PCT: 27 %
Lymphs Abs: 1.4 10*3/uL (ref 0.7–4.0)
MCH: 31.4 pg (ref 26.0–34.0)
MCHC: 34.8 g/dL (ref 30.0–36.0)
MCV: 90.3 fL (ref 78.0–100.0)
MONO ABS: 0.6 10*3/uL (ref 0.1–1.0)
MONOS PCT: 11 %
NEUTROS ABS: 3 10*3/uL (ref 1.7–7.7)
Neutrophils Relative %: 57 %
Platelets: 318 10*3/uL (ref 150–400)
RBC: 3.5 MIL/uL — ABNORMAL LOW (ref 4.22–5.81)
RDW: 16.2 % — AB (ref 11.5–15.5)
WBC: 5.3 10*3/uL (ref 4.0–10.5)

## 2018-01-20 LAB — MAGNESIUM: Magnesium: 1.4 mg/dL — ABNORMAL LOW (ref 1.7–2.4)

## 2018-01-20 LAB — GLUCOSE, CAPILLARY
GLUCOSE-CAPILLARY: 306 mg/dL — AB (ref 65–99)
GLUCOSE-CAPILLARY: 294 mg/dL — AB (ref 65–99)
Glucose-Capillary: 243 mg/dL — ABNORMAL HIGH (ref 65–99)
Glucose-Capillary: 253 mg/dL — ABNORMAL HIGH (ref 65–99)

## 2018-01-20 MED ORDER — INSULIN GLARGINE 100 UNIT/ML ~~LOC~~ SOLN
20.0000 [IU] | Freq: Every day | SUBCUTANEOUS | Status: DC
  Administered 2018-01-20 – 2018-01-21 (×2): 20 [IU] via SUBCUTANEOUS
  Filled 2018-01-20 (×2): qty 0.2

## 2018-01-20 MED ORDER — MAGNESIUM SULFATE 2 GM/50ML IV SOLN
2.0000 g | Freq: Once | INTRAVENOUS | Status: AC
  Administered 2018-01-20: 2 g via INTRAVENOUS
  Filled 2018-01-20: qty 50

## 2018-01-20 MED ORDER — LIDOCAINE HCL (PF) 2 % IJ SOLN
0.0000 mL | Freq: Once | INTRAMUSCULAR | Status: DC | PRN
  Filled 2018-01-20: qty 20

## 2018-01-20 NOTE — Progress Notes (Addendum)
Patient ID: Michael [name], male   DOB: 1961-03-21, 57 y.o.   MRN: [id number]     Subjective: Still having pain in his left earlobe.  It was better for a while after aspiration yesterday but then getting worse.  Objective: Vital signs in last 24 hours: Temp:  [98.4 F (36.9 C)-98.8 F (37.1 C)] 98.8 F (37.1 C) (04/27 0501) Pulse Rate:  [69-82] 69 (04/27 0932) Resp:  [16-18] 18 (04/27 0932) BP: (132-149)/(67-73) 132/67 (04/27 0932) SpO2:  [93 %-99 %] 97 % (04/27 0932) Weight:  [82.6 kg (182 lb 0.7 oz)] 82.6 kg (182 lb 0.7 oz) (04/26 1742) Last BM Date: 01/19/18  Intake/Output from previous day: 04/26 0701 - 04/27 0700 In: 720 [P.O.:720] Out: -  Intake/Output this shift: Total I/O In: 240 [P.O.:240] Out: -   General appearance: alert, cooperative and no distress Ears: normal TM's and external ear canals both ears and Some tenderness and swelling, question fluctuance left earlobe  Lab Results:  Recent Labs    01/19/18 0541 01/20/18 0757  WBC 5.5 5.3  HGB 11.0* 11.0*  HCT 31.7* 31.6*  PLT 257 318   BMET Recent Labs    01/19/18 0541 01/20/18 0757  NA 139 135  K 4.0 4.6  CL 107 107  CO2 22 21*  GLUCOSE 174* 270*  BUN 12 17  CREATININE 0.78 0.91  CALCIUM 9.4 9.3     Studies/Results: No results found.  Anti-infectives: Anti-infectives (From admission, onward)   Start     Dose/Rate Route Frequency Ordered Stop   01/17/18 1000  doxycycline (VIBRA-TABS) tablet 100 mg     100 mg Oral Every 12 hours 01/17/18 0912     01/16/18 2200  amoxicillin-clavulanate (AUGMENTIN) 875-125 MG per tablet 1 tablet     1 tablet Oral Every 12 hours 01/16/18 1833     01/13/18 2200  piperacillin-tazobactam (ZOSYN) IVPB 3.375 g  Status:  Discontinued     3.375 g 12.5 mL/hr over 240 Minutes Intravenous Every 8 hours 01/13/18 2109 01/16/18 1831   01/13/18 1845  cefTRIAXone (ROCEPHIN) 1 g in sodium chloride 0.9 % 100 mL IVPB     1 g 200 mL/hr over 30 Minutes Intravenous  Once 01/13/18  1839 01/13/18 1945   01/13/18 1845  azithromycin (ZITHROMAX) 500 mg in sodium chloride 0.9 % 250 mL IVPB     500 mg 250 mL/hr over 60 Minutes Intravenous  Once 01/13/18 1839 01/13/18 2005      Assessment/Plan: Abscess left earlobe.  Pus obtained from aspiration but symptoms have recurred.  I think will need wider incision and drainage.  Discussed with patient.  I will plan to do this under local anesthesia at the bedside.    LOS: 7 days    Mariella Saa 01/20/2018

## 2018-01-20 NOTE — Op Note (Signed)
Preoperative Diagnosis: Abscess left earlobe  Postoprative Diagnosis: Same Procedure:    Surgeon: Glenna Fellows T   Anesthesia:  Local anesthesia 2% buffered lidocaine  Indications: patient presents with redness swelling and pain of his left earlobe. Purulent material aspirated yesterday and sent for culture but the pain and swelling has returned. I discussed proceeding with incision and drainage under local anesthesia and he is in agreement.    Procedure Detail: the left earlobe was sterilely prepped. 2% lidocaine was used to anesthetize the skin and underlying soft tissue. I made a sharp incision down in the area of fluctuance and drained a moderate amount of pus. There was an approximately 1 cm cavity. There were some remnants of what appeared to be a sebaceous cyst that were debrided. Hemostasis was obtained with pressure. The wound was packed with gauze and dressed.   Estimated Blood Loss:  Minimal         Drains: wound packed with gauze         Specimens: none        Complications:  none

## 2018-01-20 NOTE — Progress Notes (Signed)
Patient ID: Michael [name], male   DOB: [birth year], 57 y.o.   MRN: [id number]  PROGRESS NOTE    Michael [name]  WGN:[id number] DOB: [birth year] DOA: 01/13/2018 PCP: System, Pcp Not In   Brief Narrative:  57 year old homeless man with history of alcohol abuse, diabetes mellitus was admitted on 01/13/2018 after he was found outside in the street acutely intoxicated with alcohol.  In the ED, he had a witnessed seizure.  He was intubated for airway protection and started on intravenous Zosyn for aspiration.  He was under the care of critical care team.  He self extubated on 01/14/2018.  His care it was transferred to [facility] on 01/15/2018.  He was transferred out of ICU.  Psychiatry was also consulted who determined that patient did not have evidence of imminent risk to self or others at present.   Assessment & Plan:   Principal Problem:   Alcohol use disorder, severe, dependence (HCC) Active Problems:   Acute respiratory failure with hypoxia and hypercapnia (HCC)   Pressure injury of skin  Acute hypoxic and hypercapnic respiratory failure -Status post intubation and self extubation on 01/14/2018 -Currently on room air.  Respiratory status stable  Aspiration pneumonia -Initially started on Zosyn.  Antibiotics were switched to oral Augmentin on 01/16/2018 -Cultures negative so far  Acute encephalopathy secondary to alcohol intoxication and seizure thought to be due to delirium tremens -Was on Precedex drip while on ventilator.  Currently off Precedex drip -EEG was unremarkable -Mental status has improved -Psychiatry evaluation appreciated: Psychiatry has signed off.  Continue Neurontin as per psychiatry recommendations -Fall precautions -continue folate and thiamine  Left earlobe abscess -General surgery following: Status post ultrasound-guided aspiration on 01/19/2018 by general surgery.  Follow cultures -Currently on Augmentin and doxycycline.  Will discontinue Augmentin and continue  doxycycline alone  Hypokalemia -Resolved.  Repeat a.m. Labs  Hypomagnesemia -Replace.  Repeat a.m. labs  Diabetes mellitus type 2 with hyperglycemia -Hemoglobin A1c 9.8 -Oral metformin on hold -Still hyperglycemic.  Increase Lantus to 20 units at bedtime along with sliding scale coverage.  Hypertension -Monitor blood pressure.  Continue Norvasc  Homelessness -Child psychotherapist following  DVT prophylaxis: Heparin Code Status: Full Family Communication: None at bedside Disposition Plan: Probable discharge to shelter once cleared by general surgery  Consultants: Critical care/psychiatry/general surgery  Procedures: Intubation and extubation  Antimicrobials:  Anti-infectives (From admission, onward)   Start     Dose/Rate Route Frequency Ordered Stop   01/17/18 1000  doxycycline (VIBRA-TABS) tablet 100 mg     100 mg Oral Every 12 hours 01/17/18 0912     01/16/18 2200  amoxicillin-clavulanate (AUGMENTIN) 875-125 MG per tablet 1 tablet     1 tablet Oral Every 12 hours 01/16/18 1833     01/13/18 2200  piperacillin-tazobactam (ZOSYN) IVPB 3.375 g  Status:  Discontinued     3.375 g 12.5 mL/hr over 240 Minutes Intravenous Every 8 hours 01/13/18 2109 01/16/18 1831   01/13/18 1845  cefTRIAXone (ROCEPHIN) 1 g in sodium chloride 0.9 % 100 mL IVPB     1 g 200 mL/hr over 30 Minutes Intravenous  Once 01/13/18 1839 01/13/18 1945   01/13/18 1845  azithromycin (ZITHROMAX) 500 mg in sodium chloride 0.9 % 250 mL IVPB     500 mg 250 mL/hr over 60 Minutes Intravenous  Once 01/13/18 1839 01/13/18 2005      Subjective: Patient seen and examined at bedside.  No overnight fever, nausea or vomiting.  Still complains of left earlobe pain. Objective: Vitals:  01/19/18 1742 01/19/18 2054 01/20/18 0501 01/20/18 0932  BP:  (!) 146/73 (!) 149/73 132/67  Pulse:  69 80 69  Resp:  Temp:  98.7 F (37.1 C) 98.8 F (37.1 C)   TempSrc:  Oral Oral   SpO2:  93% 97% 97%  Weight: 82.6 kg (182 lb  0.7 oz)     Height:        Intake/Output Summary (Last 24 hours) at 01/20/2018 1138 Last data filed at 01/20/2018 1130 Gross per 24 hour  Intake 960 ml  Output -  Net 960 ml   Filed Weights   01/15/18 0402 01/16/18 0326 01/19/18 1742  Weight: 83 kg (182 lb 15.7 oz) 82.4 kg (181 lb 9.6 oz) 82.6 kg (182 lb 0.7 oz)    Examination:  General exam: No distress.  Left earlobe fluctuance present with mild tenderness Respiratory system: Bilateral decreased breath sounds at bases. cardiovascular system: Rate controlled, S1-S2 positive gastrointestinal system: Abdomen is nondistended, soft and nontender. Normal bowel sounds heard. Extremities: No cyanosis, edema     Data Reviewed: I have personally reviewed following labs and imaging studies  CBC: Recent Labs  Lab 01/13/18 1851 01/13/18 2150 01/14/18 0347 01/16/18 0618 01/18/18 0543 01/19/18 0541 01/20/18 0757  WBC 8.1 11.1* 9.6 6.9 6.4 5.5 5.3  NEUTROABS 6.7 8.5*  --   --  4.1 3.3 3.0  HGB 11.1* 11.7* 11.0* 11.2* 10.7* 11.0* 11.0*  HCT 31.5* 33.8* 31.2* 32.3* 30.9* 31.7* 31.6*  MCV 88.7 90.6 89.1 88.3 90.1 89.5 90.3  PLT 177 220 174 184 216 257 318   Basic Metabolic Panel: Recent Labs  Lab 01/13/18 2150 01/14/18 0347 01/15/18 0335 01/16/18 0618 01/17/18 0609 01/18/18 0543 01/19/18 0541 01/20/18 0757  NA 148* 150* 146* 140 137 137 139 135  K 3.5 3.4* 3.2* 3.4* 4.3 4.3 4.0 4.6  CL 119* 118* 114* 108 106 106 107 107  CO2 19* 23 19* 22 22 21* 22 21*  GLUCOSE 134* 175* 124* 181* 249* 246* 174* 270*  BUN CREATININE 0.68 0.74 0.90 0.80 0.96 0.81 0.78 0.91  CALCIUM 7.8* 8.0* 8.4* 8.6* 8.9 9.0 9.4 9.3  MG 1.1* 1.0* 1.9 1.4* 1.7 1.4* 1.5* 1.4*  PHOS 2.0* 2.2* 2.1* 3.0  --   --   --   --    GFR: Estimated Creatinine Clearance: 93.6 mL/min (by C-G formula based on SCr of 0.91 mg/dL). Liver Function Tests: Recent Labs  Lab 01/13/18 1616 01/13/18 2150  AST 31 25  ALT 12* 14*  ALKPHOS 91 92    BILITOT 0.8 0.2*  PROT 7.7 7.3  ALBUMIN 3.7 3.4*   No results for input(s): LIPASE, AMYLASE in the last 168 hours. No results for input(s): AMMONIA in the last 168 hours. Coagulation Profile: Recent Labs  Lab 01/13/18 2150  INR 1.00   Cardiac Enzymes: Recent Labs  Lab 01/13/18 2150 01/14/18 0347 01/14/18 0858  CKTOTAL 466*  --   --   TROPONINI 0.06*  0.06* 0.03* 0.03*   BNP (last 3 results) No results for input(s): PROBNP in the last 8760 hours. HbA1C: No results for input(s): HGBA1C in the last 72 hours. CBG: Recent Labs  Lab 01/19/18 0725 01/19/18 1138 01/19/18 1643 01/19/18 2118 01/20/18 0750  GLUCAP 159* 257* 235* 271* 243*   Lipid Profile: No results for input(s): CHOL, HDL, LDLCALC, TRIG, CHOLHDL, LDLDIRECT in the last 72 hours. Thyroid Function Tests: No results for input(s):  TSH, T4TOTAL, FREET4, T3FREE, THYROIDAB in the last 72 hours. Anemia Panel: No results for input(s): VITAMINB12, FOLATE, FERRITIN, TIBC, IRON, RETICCTPCT in the last 72 hours. Sepsis Labs: Recent Labs  Lab 01/13/18 1907 01/13/18 2150 01/14/18 0110 01/14/18 0347 01/15/18 0335  PROCALCITON  --  <0.10  <0.10  --  <0.10 0.30  LATICACIDVEN 2.34* 1.5 1.9  --   --     Recent Results (from the past 240 hour(s))  Blood culture (routine x 2)     Status: None   Collection Time: 01/13/18  6:51 PM  Result Value Ref Range Status   Specimen Description   Final    BLOOD RIGHT HAND Blood Culture results may not be optimal due to an inadequate volume of blood received in culture bottles Performed at [facility], [address] [place], [place] [address]    Special Requests   Final    BOTTLES DRAWN AEROBIC ONLY Performed at [facility], [address], [place], [place] [address]    Culture   Final    NO GROWTH 5 DAYS Performed at [facility], [address], [place], [place] [address]    Report Status 01/19/2018 FINAL  Final  Blood  culture (routine x 2)     Status: None   Collection Time: 01/13/18  6:51 PM  Result Value Ref Range Status   Specimen Description BLOOD LEFT ANTECUBITAL  Final   Special Requests   Final    BOTTLES DRAWN AEROBIC AND ANAEROBIC Blood Culture adequate volume   Culture   Final    NO GROWTH 5 DAYS Performed at [facility], [address], [place], [place] [address]    Report Status 01/19/2018 FINAL  Final  Urine culture     Status: None   Collection Time: 01/13/18  9:50 PM  Result Value Ref Range Status   Specimen Description   Final    URINE, RANDOM Performed at [facility], [address], [place], [place] [address]    Special Requests   Final    NONE Performed at [facility], [address] [place], [place] [address]    Culture   Final    NO GROWTH Performed at [facility], [address] [place], [place] [address]    Report Status 01/15/2018 FINAL  Final  MRSA PCR Screening     Status: None   Collection Time: 01/13/18 11:08 PM  Result Value Ref Range Status   MRSA by PCR NEGATIVE NEGATIVE Final    Comment:        The GeneXpert MRSA Assay (FDA approved for NASAL specimens only), is one component of a comprehensive MRSA colonization surveillance program. It is not intended to diagnose MRSA infection nor to guide or monitor treatment for MRSA infections. Performed at [facility], [address], [place], [place] [address]   Aerobic/Anaerobic Culture (surgical/deep wound)     Status: None (Preliminary result)   Collection Time: 01/19/18  1:37 PM  Result Value Ref Range Status   Specimen Description   Final    EAR LEFT ABSCESS Performed at [facility], [address], [place], [place] [address]    Special Requests   Final    NONE Performed at [facility], [address], [place], [place] [address]    Gram Stain   Final    ABUNDANT WBC PRESENT,  PREDOMINANTLY PMN RARE GRAM POSITIVE COCCI  Performed at [facility], [address] [place], [place] [address]    Culture PENDING  Incomplete   Report Status PENDING  Incomplete         Radiology Studies: No results found.      Scheduled Meds: . amLODipine  5 mg Oral Daily  . amoxicillin-clavulanate  1 tablet Oral Q12H  . doxycycline  100 mg Oral Q12H  . folic acid  1 mg Oral Daily  . gabapentin  300 mg Oral TID  . heparin  5,000 Units Subcutaneous Q8H  . insulin aspart  0-5 Units Subcutaneous QHS  . insulin aspart  0-9 Units Subcutaneous TID WC  . insulin glargine  15 Units Subcutaneous QHS  . multivitamin with minerals  1 tablet Oral Q24H  . pantoprazole  20 mg Oral Daily  . pentafluoroprop-tetrafluoroeth   Topical Once  . saccharomyces boulardii  250 mg Oral BID  . sodium chloride flush  10-40 mL Intracatheter Q12H  . thiamine  100 mg Oral Daily   Continuous Infusions:   LOS: 7 days        Glade Lloyd, MD [facility] Pager [phone]  If 7PM-7AM, please contact night-coverage [email or web address] Password [facility] 01/20/2018, 11:38 AM

## 2018-01-21 LAB — CBC WITH DIFFERENTIAL/PLATELET
BASOS PCT: 0 %
Basophils Absolute: 0 10*3/uL (ref 0.0–0.1)
EOS ABS: 0.2 10*3/uL (ref 0.0–0.7)
EOS PCT: 5 %
HCT: 32.3 % — ABNORMAL LOW (ref 39.0–52.0)
Hemoglobin: 11 g/dL — ABNORMAL LOW (ref 13.0–17.0)
Lymphocytes Relative: 33 %
Lymphs Abs: 1.5 10*3/uL (ref 0.7–4.0)
MCH: 30.9 pg (ref 26.0–34.0)
MCHC: 34.1 g/dL (ref 30.0–36.0)
MCV: 90.7 fL (ref 78.0–100.0)
MONO ABS: 0.4 10*3/uL (ref 0.1–1.0)
Monocytes Relative: 8 %
Neutro Abs: 2.4 10*3/uL (ref 1.7–7.7)
Neutrophils Relative %: 54 %
PLATELETS: 370 10*3/uL (ref 150–400)
RBC: 3.56 MIL/uL — AB (ref 4.22–5.81)
RDW: 16.3 % — AB (ref 11.5–15.5)
WBC: 4.5 10*3/uL (ref 4.0–10.5)

## 2018-01-21 LAB — MAGNESIUM: MAGNESIUM: 1.4 mg/dL — AB (ref 1.7–2.4)

## 2018-01-21 LAB — BASIC METABOLIC PANEL
ANION GAP: 8 (ref 5–15)
BUN: 17 mg/dL (ref 6–20)
CALCIUM: 9.6 mg/dL (ref 8.9–10.3)
CO2: 21 mmol/L — ABNORMAL LOW (ref 22–32)
Chloride: 110 mmol/L (ref 101–111)
Creatinine, Ser: 0.82 mg/dL (ref 0.61–1.24)
GLUCOSE: 145 mg/dL — AB (ref 65–99)
Potassium: 5 mmol/L (ref 3.5–5.1)
SODIUM: 139 mmol/L (ref 135–145)

## 2018-01-21 LAB — GLUCOSE, CAPILLARY
GLUCOSE-CAPILLARY: 267 mg/dL — AB (ref 65–99)
GLUCOSE-CAPILLARY: 97 mg/dL (ref 65–99)
Glucose-Capillary: 414 mg/dL — ABNORMAL HIGH (ref 65–99)
Glucose-Capillary: 280 mg/dL — ABNORMAL HIGH (ref 65–99)

## 2018-01-21 MED ORDER — IBUPROFEN 800 MG PO TABS: 800.0000 mg | ORAL_TABLET | Freq: Three times a day (TID) | ORAL | Status: AC | PRN

## 2018-01-21 MED ORDER — AMLODIPINE BESYLATE 5 MG PO TABS: 5.0000 mg | ORAL_TABLET | Freq: Every day | ORAL | 0 refills | Status: AC

## 2018-01-21 MED ORDER — ADULT MULTIVITAMIN W/MINERALS CH: 1.0000 | ORAL_TABLET | ORAL | 0 refills | Status: AC

## 2018-01-21 MED ORDER — INSULIN GLARGINE 100 UNIT/ML ~~LOC~~ SOLN: 20.0000 [IU] | Freq: Every day | SUBCUTANEOUS | Status: AC

## 2018-01-21 MED ORDER — GABAPENTIN 300 MG PO CAPS: 300.0000 mg | ORAL_CAPSULE | Freq: Three times a day (TID) | ORAL | 0 refills | Status: AC

## 2018-01-21 MED ORDER — VITAMIN B-1 100 MG PO TABS: 100.0000 mg | ORAL_TABLET | Freq: Every day | ORAL | 0 refills | Status: AC

## 2018-01-21 MED ORDER — FOLIC ACID 1 MG PO TABS: 1.0000 mg | ORAL_TABLET | Freq: Every day | ORAL | 0 refills | Status: AC

## 2018-01-21 NOTE — Discharge Summary (Addendum)
Physician Discharge Summary  Michael [name] ZOX:[id number] DOB: [birth year] DOA: 01/13/2018  PCP: System, Pcp Not In  Admit date: 01/13/2018 Discharge date: 01/22/2018 Admitted From: Shelter Disposition: Shelter  Recommendations for Outpatient Follow-up:  1. Follow up with PCP in 1 week 2. Wound care/dressing changes as per general surgery recommendations  Home Health: No Equipment/Devices: None  Discharge Condition: Stable CODE STATUS: Full Diet recommendation: Heart Healthy / Carb Modified   Brief/Interim Summary: 57 year old homeless man with history of alcohol abuse, diabetes mellitus was admitted on 01/13/2018 after he was found outside in the street acutely intoxicated with alcohol.  In the ED, he had a witnessed seizure.  He was intubated for airway protection and started on intravenous Zosyn for aspiration.  He was under the care of critical care team.  He self extubated on 01/14/2018.  His care it was transferred to [facility] on 01/15/2018.  He was transferred out of ICU.  Psychiatry was also consulted who determined that patient did not have evidence of imminent risk to self or others at present.  She was also found to have left earlobe abscess for which she underwent I&D on 01/20/2018.  General surgery has cleared the patient for discharge.  He will be discharged back to shelter.  Addendum: Patient will be discharged to the shelter today as there was no bed available on 01/21/2018.   Discharge Diagnoses:  Principal Problem:   Alcohol use disorder, severe, dependence (HCC) Active Problems:   Acute respiratory failure with hypoxia and hypercapnia (HCC)   Pressure injury of skin  Acute hypoxic and hypercapnic respiratory failure -Status post intubation and self extubation on 01/14/2018 -Currently on room air.  Respiratory status stable  Aspiration pneumonia -Initially started on Zosyn.  Antibiotics were switched to oral Augmentin on 01/16/2018.  Completed antibiotic  course. -Cultures negative so far  Acute encephalopathy secondary to alcohol intoxication and seizure thought to be due to delirium tremens -Was on Precedex drip while on ventilator.  Currently off Precedex drip -EEG was unremarkable -Mental status has improved -Psychiatry evaluation appreciated: Psychiatry has signed off.  Continue Neurontin as per psychiatry recommendations.  Outpatient follow-up with primary care provider and/or psychiatry -continue folate, multivitamin and thiamine  Left earlobe abscess -Status post ultrasound-guided aspiration on 01/19/2018 by general surgery.  Status post IND on 01/20/2018 by general surgery.  General surgery has cleared the patient for discharge and suggest no more antibiotics.  Wound care as per general surgery recommendations -Patient has been on doxycycline so far and was also on Augmentin  Hypokalemia -Resolved.    No labs today  Hypomagnesemia -Replaced.  No labs today  Diabetes mellitus type 2 with hyperglycemia -Hemoglobin A1c 9.8 -Metformin on discharge.  Continue Lantus 20 units at bedtime.  Outpatient follow-up  Hypertension -Monitor blood pressure.  Continue Norvasc  Homelessness -Will discharge back to shelter today.  Social worker following    Discharge Instructions  Discharge Instructions    Call MD for:  difficulty breathing, headache or visual disturbances   Complete by:  As directed    Call MD for:  extreme fatigue   Complete by:  As directed    Call MD for:  hives   Complete by:  As directed    Call MD for:  persistant dizziness or light-headedness   Complete by:  As directed    Call MD for:  persistant nausea and vomiting   Complete by:  As directed    Call MD for:  redness, tenderness, or signs of infection (  pain, swelling, redness, odor or green/yellow discharge around incision site)   Complete by:  As directed    Call MD for:  severe uncontrolled pain   Complete by:  As directed    Call MD for:   temperature >100.4   Complete by:  As directed    Diet - low sodium heart healthy   Complete by:  As directed    Carb modified diet   Increase activity slowly   Complete by:  As directed      Allergies as of 01/21/2018      Reactions   Lisinopril Swelling   Received ACEi with resultant angioedema, pt previously denied but now recalls previous episode with "blood pressure medicine"        Medication List    STOP taking these medications   HYDROcodone-acetaminophen 5-325 MG tablet Commonly known as:  NORCO/VICODIN     TAKE these medications   amLODipine 5 MG tablet Commonly known as:  NORVASC Take 1 tablet (5 mg total) by mouth daily. Start taking on:  01/22/2018   folic acid 1 MG tablet Commonly known as:  FOLVITE Take 1 tablet (1 mg total) by mouth daily. Start taking on:  01/22/2018   gabapentin 300 MG capsule Commonly known as:  NEURONTIN Take 1 capsule (300 mg total) by mouth 3 (three) times daily.   ibuprofen 800 MG tablet Commonly known as:  ADVIL,MOTRIN Take 1 tablet (800 mg total) by mouth every 8 (eight) hours as needed for moderate pain. What changed:    when to take this  reasons to take this   insulin glargine 100 UNIT/ML injection Commonly known as:  LANTUS Inject 0.2 mLs (20 Units total) into the skin at bedtime. What changed:  how much to take   metFORMIN 500 MG tablet Commonly known as:  GLUCOPHAGE Take 500 mg by mouth 2 (two) times daily with a meal.   multivitamin with minerals Tabs tablet Take 1 tablet by mouth daily.   thiamine 100 MG tablet Commonly known as:  VITAMIN B-1 Take 1 tablet (100 mg total) by mouth daily.      Follow-up Information    PCP. Schedule an appointment as soon as possible for a visit in 1 week(s).          Allergies  Allergen Reactions  . Lisinopril Swelling    Received ACEi with resultant angioedema, pt previously denied but now recalls previous episode with "blood pressure medicine"       Consultations:  Critical care/psychiatric/general surgery   Procedures/Studies: Dg Ankle Complete Left  Result Date: 12/22/2017 CLINICAL DATA:  Knee and ankle pain.  Fall 3 days ago. EXAM: LEFT ANKLE COMPLETE - 3+ VIEW COMPARISON:  None. FINDINGS: No acute bony abnormality. Specifically, no fracture, subluxation, or dislocation. 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 IMPRESSION: No acute bony abnormality. Electronically Signed   By: Charlett Nose M.D.   On: 12/22/2017 11:47   Ct Head Wo Contrast  Result Date: 01/13/2018 CLINICAL DATA:  Patient found down. Blood coming out of mouth. Concern for head or cervical spine injury. Hyperglycemia. EXAM: CT HEAD WITHOUT CONTRAST CT CERVICAL SPINE WITHOUT CONTRAST TECHNIQUE: Multidetector CT imaging of the head and cervical spine was performed following the standard protocol without intravenous contrast. Multiplanar CT image reconstructions of the cervical spine were also generated. COMPARISON:  CT of the head performed 11/26/2017 FINDINGS: CT HEAD FINDINGS Brain: No evidence of acute infarction, hemorrhage, hydrocephalus, extra-axial collection or mass lesion/mass effect. Evaluation is mildly  suboptimal due to motion artifact. The posterior fossa, including the cerebellum, brainstem and fourth ventricle, is within normal limits. The third and lateral ventricles, and basal ganglia are unremarkable in appearance. The cerebral hemispheres are symmetric in appearance, with normal gray-white differentiation. No mass effect or midline shift is seen. Vascular: No hyperdense vessel or unexpected calcification. Skull: There is no evidence of fracture; visualized osseous structures are unremarkable in appearance. Sinuses/Orbits: The orbits are within normal limits. The paranasal sinuses and mastoid air cells are well-aerated. Other: No significant soft tissue abnormalities are seen. CT CERVICAL SPINE FINDINGS Alignment: Normal. Evaluation is significantly  suboptimal due to motion artifact. Skull base and vertebrae: No acute fracture. No primary bone lesion or focal pathologic process. Soft tissues and spinal canal: No prevertebral fluid or swelling. No visible canal hematoma. Disc levels: Multilevel disc space narrowing is noted along the cervical spine. Degenerative change is noted about the dens. Upper chest: Scattered blebs are noted at the lung apices. The thyroid gland is grossly unremarkable, though not well assessed due to motion artifact. Calcification is noted at the carotid bifurcations bilaterally. Other: No additional soft tissue abnormalities are seen. IMPRESSION: 1. No evidence of traumatic intracranial injury or fracture. 2. No evidence of fracture or subluxation along the cervical spine. 3. Mild degenerative change along the cervical spine. 4. Scattered blebs at the lung apices. 5. Calcification at the carotid bifurcations bilaterally. Carotid ultrasound would be helpful for further evaluation, when and as deemed clinically appropriate. Electronically Signed   By: Roanna Raider M.D.   On: 01/13/2018 06:43   Ct Angio Chest Pe W And/or Wo Contrast  Result Date: 01/13/2018 CLINICAL DATA:  Pulmonary embolus suspected, high pretest probability EXAM: CT ANGIOGRAPHY CHEST WITH CONTRAST TECHNIQUE: Multidetector CT imaging of the chest was performed using the standard protocol during bolus administration of intravenous contrast. Multiplanar CT image reconstructions and MIPs were obtained to evaluate the vascular anatomy. CONTRAST:  <See Chart> ISOVUE-370 IOPAMIDOL (ISOVUE-370) INJECTION 76% COMPARISON:  Chest x-ray performed today FINDINGS: Cardiovascular: No visible filling defects in the pulmonary arteries to suggest pulmonary emboli. Motion degrades image quality in the lung bases. Heart is borderline in size. Aorta is normal caliber. Mediastinum/Nodes: No mediastinal, hilar, or axillary adenopathy. Lungs/Pleura: Apical blebs/bullae. Ground-glass  airspace opacities noted in both lower lobes as well as in the lingula, most pronounced in the right lower lobe where there is nodular ground-glass airspace disease. Favor early pneumonia. No effusions. Upper Abdomen: Imaging into the upper abdomen shows no acute findings. Musculoskeletal: Chest wall soft tissues are unremarkable. Compression fractures noted in the mid thoracic spine, age indeterminate. Review of the MIP images confirms the above findings. IMPRESSION: No evidence of pulmonary embolus. Motion degrades image quality in the lung bases. Cardiomegaly. Ground-glass airspace disease, most pronounced in the right lower lobe also noted in the left lower lobe and lingula concerning for early multifocal pneumonia. Multiple mild to moderate compression fractures in the midthoracic spine, age indeterminate. Electronically Signed   By: Charlett Nose M.D.   On: 01/13/2018 18:25   Ct Cervical Spine Wo Contrast  Result Date: 01/13/2018 CLINICAL DATA:  Patient found down. Blood coming out of mouth. Concern for head or cervical spine injury. Hyperglycemia. EXAM: CT HEAD WITHOUT CONTRAST CT CERVICAL SPINE WITHOUT CONTRAST TECHNIQUE: Multidetector CT imaging of the head and cervical spine was performed following the standard protocol without intravenous contrast. Multiplanar CT image reconstructions of the cervical spine were also generated. COMPARISON:  CT of the head performed 11/26/2017  FINDINGS: CT HEAD FINDINGS Brain: No evidence of acute infarction, hemorrhage, hydrocephalus, extra-axial collection or mass lesion/mass effect. Evaluation is mildly suboptimal due to motion artifact. The posterior fossa, including the cerebellum, brainstem and fourth ventricle, is within normal limits. The third and lateral ventricles, and basal ganglia are unremarkable in appearance. The cerebral hemispheres are symmetric in appearance, with normal gray-white differentiation. No mass effect or midline shift is seen. Vascular: No  hyperdense vessel or unexpected calcification. Skull: There is no evidence of fracture; visualized osseous structures are unremarkable in appearance. Sinuses/Orbits: The orbits are within normal limits. The paranasal sinuses and mastoid air cells are well-aerated. Other: No significant soft tissue abnormalities are seen. CT CERVICAL SPINE FINDINGS Alignment: Normal. Evaluation is significantly suboptimal due to motion artifact. Skull base and vertebrae: No acute fracture. No primary bone lesion or focal pathologic process. Soft tissues and spinal canal: No prevertebral fluid or swelling. No visible canal hematoma. Disc levels: Multilevel disc space narrowing is noted along the cervical spine. Degenerative change is noted about the dens. Upper chest: Scattered blebs are noted at the lung apices. The thyroid gland is grossly unremarkable, though not well assessed due to motion artifact. Calcification is noted at the carotid bifurcations bilaterally. Other: No additional soft tissue abnormalities are seen. IMPRESSION: 1. No evidence of traumatic intracranial injury or fracture. 2. No evidence of fracture or subluxation along the cervical spine. 3. Mild degenerative change along the cervical spine. 4. Scattered blebs at the lung apices. 5. Calcification at the carotid bifurcations bilaterally. Carotid ultrasound would be helpful for further evaluation, when and as deemed clinically appropriate. Electronically Signed   By: Roanna Raider M.D.   On: 01/13/2018 06:43   Dg Chest Port 1 View  Result Date: 01/15/2018 CLINICAL DATA:  Acute respiratory failure EXAM: PORTABLE CHEST 1 VIEW COMPARISON:  01/14/2018 FINDINGS: Endotracheal tube and NG tube removed. Improved aeration in the lung bases. Improved lung volume. IMPRESSION: Endotracheal tube removed. Significant improved aeration in the lungs bilaterally. Electronically Signed   By: Marlan Palau M.D.   On: 01/15/2018 07:49   Dg Chest Port 1 View  Result Date:  01/14/2018 CLINICAL DATA:  Endotracheal tube and enteric tube placement. EXAM: PORTABLE CHEST 1 VIEW COMPARISON:  Chest radiograph performed 01/13/2018 FINDINGS: The patient's endotracheal tube is seen ending 4 cm above the carina. An enteric tube is noted extending below the diaphragm. A small right pleural effusion is noted. Vascular congestion is noted. New patchy bilateral airspace opacification may reflect pulmonary edema or pneumonia. No pneumothorax is seen. The cardiomediastinal silhouette is mildly enlarged. No acute osseous abnormalities are identified. IMPRESSION: 1. Endotracheal tube seen ending 4 cm above the carina. 2. Enteric tube noted extending below the diaphragm. 3. Small right pleural effusion. Vascular congestion and mild cardiomegaly. New patchy bilateral airspace opacification may reflect pulmonary edema or pneumonia. Electronically Signed   By: Roanna Raider M.D.   On: 01/14/2018 05:41   Dg Chest Portable 1 View  Result Date: 01/13/2018 CLINICAL DATA:  Endotracheal tube replacement. EXAM: PORTABLE CHEST 1 VIEW COMPARISON:  Chest radiograph performed earlier today at 9:10 p.m. FINDINGS: The patient's endotracheal tube is seen ending 3-4 cm above the carina. An enteric tube is noted extending below the diaphragm. Mildly worsening right basilar airspace opacification is noted. Underlying vascular congestion is seen. Increased interstitial markings are noted. Given appearance on recent CT, findings are concerning for worsening pneumonia. The cardiomediastinal silhouette is borderline normal in size. No acute osseous abnormalities are identified. IMPRESSION:  1. Endotracheal tube seen ending 3-4 cm above the carina. 2. Mildly worsening pneumonia, particularly on the right. Electronically Signed   By: Roanna Raider M.D.   On: 01/13/2018 22:52   Dg Chest Port 1 View  Result Date: 01/13/2018 CLINICAL DATA:  Check gastric catheter placement EXAM: PORTABLE CHEST 1 VIEW COMPARISON:  None.  FINDINGS: Gastric catheter is noted coiled within the stomach. Cardiac shadow is within normal limits. Patchy changes are noted in the bases similar to that seen on prior CT. No bony abnormality is noted. IMPRESSION: Gastric catheter within the stomach. Patchy bibasilar changes similar to that seen on recent chest CT. Electronically Signed   By: Alcide Clever M.D.   On: 01/13/2018 21:38   Dg Chest Port 1 View  Result Date: 01/13/2018 CLINICAL DATA:  SOB, pt for med clearance, Pt was found laying on the pavement. [employer] was on scene and found a fifth of vodka in the pt's backpack that was almost gone. Pt is combative and not able to follow commands. EXAM: PORTABLE CHEST 1 VIEW COMPARISON:  None. FINDINGS: The heart size and mediastinal contours are within normal limits. Both lungs are clear. The visualized skeletal structures are unremarkable. IMPRESSION: No active disease. Electronically Signed   By: Norva Pavlov M.D.   On: 01/13/2018 16:46   Dg Knee Complete 4 Views Left  Result Date: 12/22/2017 CLINICAL DATA:  Left knee and ankle pain.  Fall 3 days ago. EXAM: LEFT KNEE - COMPLETE 4+ VIEW COMPARISON:  12/10/2017 FINDINGS: Mild joint space narrowing in the medial compartment. No acute bony abnormality. Specifically, no fracture, subluxation, or dislocation. No joint effusion. IMPRESSION: No acute bony abnormality. Electronically Signed   By: Charlett Nose M.D.   On: 12/22/2017 11:46     Subjective: Patient seen and examined at bedside.  He denies any overnight fever, nausea or vomiting.  Discharge Exam: Vitals:   01/20/18 1944 01/21/18 0531  BP: 130/70 136/73  Pulse: 75 79  Resp: 18 16  Temp: 97.9 F (36.6 C) 98.2 F (36.8 C)  SpO2: 98% 97%   Vitals:   01/20/18 1818 01/20/18 1944 01/21/18 0500 01/21/18 0531  BP:  130/70  136/73  Pulse:  75  79  Resp:  18  16  Temp:  97.9 F (36.6 C)  98.2 F (36.8 C)  TempSrc:  Oral  Oral  SpO2:  98%  97%  Weight: 81.6 kg (179 lb 14.4 oz)  81.6 kg  (179 lb 14.4 oz)   Height:        General: Pt is alert, awake, not in acute distress.  Left ear dressing present Cardiovascular: Rate controlled, S1/S2 + Respiratory: Bilateral decreased breath sounds at bases Abdominal: Soft, NT, ND, bowel sounds + Extremities: no edema, no cyanosis    The results of significant diagnostics from this hospitalization (including imaging, microbiology, ancillary and laboratory) are listed below for reference.     Microbiology: Recent Results (from the past 240 hour(s))  Blood culture (routine x 2)     Status: None   Collection Time: 01/13/18  6:51 PM  Result Value Ref Range Status   Specimen Description   Final    BLOOD RIGHT HAND Blood Culture results may not be optimal due to an inadequate volume of blood received in culture bottles Performed at [facility], [address], [place], [place] [address]    Special Requests   Final    BOTTLES DRAWN AEROBIC ONLY Performed at [facility],  [address], [place], [place] [address]    Culture   Final    NO GROWTH 5 DAYS Performed at [facility], [address] [place], [place] [address]    Report Status 01/19/2018 FINAL  Final  Blood culture (routine x 2)     Status: None   Collection Time: 01/13/18  6:51 PM  Result Value Ref Range Status   Specimen Description BLOOD LEFT ANTECUBITAL  Final   Special Requests   Final    BOTTLES DRAWN AEROBIC AND ANAEROBIC Blood Culture adequate volume   Culture   Final    NO GROWTH 5 DAYS Performed at [facility], [address] [place], [place] [address]    Report Status 01/19/2018 FINAL  Final  Urine culture     Status: None   Collection Time: 01/13/18  9:50 PM  Result Value Ref Range Status   Specimen Description   Final    URINE, RANDOM Performed at [facility], [address], [place], [place] [address]    Special Requests   Final    NONE Performed at [facility], [address], [place], [place] [address]    Culture   Final    NO GROWTH Performed at [facility], [address] [place], [place] [address]    Report Status 01/15/2018 FINAL  Final  MRSA PCR Screening     Status: None   Collection Time: 01/13/18 11:08 PM  Result Value Ref Range Status   MRSA by PCR NEGATIVE NEGATIVE Final    Comment:        The GeneXpert MRSA Assay (FDA approved for NASAL specimens only), is one component of a comprehensive MRSA colonization surveillance program. It is not intended to diagnose MRSA infection nor to guide or monitor treatment for MRSA infections. Performed at [facility], [address], [place], [place] [address]   Aerobic/Anaerobic Culture (surgical/deep wound)     Status: None (Preliminary result)   Collection Time: 01/19/18  1:37 PM  Result Value Ref Range Status   Specimen Description   Final    EAR LEFT ABSCESS Performed at [facility], [address], [place], [place] [address]    Special Requests   Final    NONE Performed at [facility], [address], [place], [place] [address]    Gram Stain   Final    ABUNDANT WBC PRESENT, PREDOMINANTLY PMN RARE GRAM POSITIVE COCCI    Culture   Final    CULTURE REINCUBATED FOR BETTER GROWTH Performed at [facility], [address], [place], [place] [address]    Report Status PENDING  Incomplete     Labs: BNP (last 3 results) Recent Labs    01/13/18 2150  BNP 187.7*   Basic Metabolic Panel: Recent Labs  Lab 01/15/18 0335 01/16/18 0618 01/17/18 0609 01/18/18 0543 01/19/18 0541 01/20/18 0757  NA 146* 140 137 137 139 135  K 3.2* 3.4* 4.3 4.3 4.0 4.6  CL 114* 108 106 106 107 107  CO2 19* 22 22 21* 22 21*  GLUCOSE 124* 181* 249* 246* 174* 270*  BUN CREATININE 0.90 0.80 0.96 0.81 0.78 0.91  CALCIUM 8.4* 8.6* 8.9 9.0 9.4 9.3  MG 1.9 1.4* 1.7 1.4* 1.5* 1.4*  PHOS 2.1* 3.0  --    --   --   --    Liver Function  Tests: No results for input(s): AST, ALT, ALKPHOS, BILITOT, PROT, ALBUMIN in the last 168 hours. No results for input(s): LIPASE, AMYLASE in the last 168 hours. No results for input(s): AMMONIA in the last 168 hours. CBC: Recent Labs  Lab 01/16/18 0618 01/18/18 0543 01/19/18 0541 01/20/18 0757  WBC 6.9 6.4 5.5 5.3  NEUTROABS  --  4.1 3.3 3.0  HGB 11.2* 10.7* 11.0* 11.0*  HCT 32.3* 30.9* 31.7* 31.6*  MCV 88.3 90.1 89.5 90.3  PLT 184 216 257 318   Cardiac Enzymes: No results for input(s): CKTOTAL, CKMB, CKMBINDEX, TROPONINI in the last 168 hours. BNP: Invalid input(s): POCBNP CBG: Recent Labs  Lab 01/20/18 0750 01/20/18 1206 01/20/18 1554 01/20/18 2125 01/21/18 0825  GLUCAP 243* 306* 253* 294* 267*   D-Dimer No results for input(s): DDIMER in the last 72 hours. Hgb A1c No results for input(s): HGBA1C in the last 72 hours. Lipid Profile No results for input(s): CHOL, HDL, LDLCALC, TRIG, CHOLHDL, LDLDIRECT in the last 72 hours. Thyroid function studies No results for input(s): TSH, T4TOTAL, T3FREE, THYROIDAB in the last 72 hours.  Invalid input(s): FREET3 Anemia work up No results for input(s): VITAMINB12, FOLATE, FERRITIN, TIBC, IRON, RETICCTPCT in the last 72 hours. Urinalysis    Component Value Date/Time   COLORURINE STRAW (A) 01/13/2018 2150   APPEARANCEUR CLEAR 01/13/2018 2150   LABSPEC 1.014 01/13/2018 2150   PHURINE 5.0 01/13/2018 2150   GLUCOSEU 150 (A) 01/13/2018 2150   HGBUR SMALL (A) 01/13/2018 2150   BILIRUBINUR NEGATIVE 01/13/2018 2150   KETONESUR NEGATIVE 01/13/2018 2150   PROTEINUR NEGATIVE 01/13/2018 2150   NITRITE NEGATIVE 01/13/2018 2150   LEUKOCYTESUR NEGATIVE 01/13/2018 2150   Sepsis Labs Invalid input(s): PROCALCITONIN,  WBC,  LACTICIDVEN Microbiology Recent Results (from the past 240 hour(s))  Blood culture (routine x 2)     Status: None   Collection Time: 01/13/18  6:51 PM  Result Value Ref Range  Status   Specimen Description   Final    BLOOD RIGHT HAND Blood Culture results may not be optimal due to an inadequate volume of blood received in culture bottles Performed at [facility], [address] [place], [place] [address]    Special Requests   Final    BOTTLES DRAWN AEROBIC ONLY Performed at [facility], [address] [place], [place] [address]    Culture   Final    NO GROWTH 5 DAYS Performed at [facility], [address], [place], [place] [address]    Report Status 01/19/2018 FINAL  Final  Blood culture (routine x 2)     Status: None   Collection Time: 01/13/18  6:51 PM  Result Value Ref Range Status   Specimen Description BLOOD LEFT ANTECUBITAL  Final   Special Requests   Final    BOTTLES DRAWN AEROBIC AND ANAEROBIC Blood Culture adequate volume   Culture   Final    NO GROWTH 5 DAYS Performed at [facility], [address], [place], [place] [address]    Report Status 01/19/2018 FINAL  Final  Urine culture     Status: None   Collection Time: 01/13/18  9:50 PM  Result Value Ref Range Status   Specimen Description   Final    URINE, RANDOM Performed at [facility], [address], [place], [place] [address]    Special Requests   Final    NONE Performed at [facility], [address]  [place], [place] [address]    Culture   Final    NO GROWTH Performed at [facility], [address], [place], [place] [address]    Report Status 01/15/2018 FINAL  Final  MRSA PCR Screening     Status: None   Collection Time: 01/13/18 11:08 PM  Result Value Ref Range Status   MRSA by PCR NEGATIVE NEGATIVE Final    Comment:        The GeneXpert MRSA Assay (FDA approved for NASAL specimens only), is one component of a comprehensive MRSA colonization surveillance program. It is not intended to diagnose MRSA infection nor to guide or monitor treatment for MRSA  infections. Performed at [facility], [address] [place], [place] [address]   Aerobic/Anaerobic Culture (surgical/deep wound)     Status: None (Preliminary result)   Collection Time: 01/19/18  1:37 PM  Result Value Ref Range Status   Specimen Description   Final    EAR LEFT ABSCESS Performed at [facility], [address] [place], [place] [address]    Special Requests   Final    NONE Performed at [facility], [address], [place], [place] [address]    Gram Stain   Final    ABUNDANT WBC PRESENT, PREDOMINANTLY PMN RARE GRAM POSITIVE COCCI    Culture   Final    CULTURE REINCUBATED FOR BETTER GROWTH Performed at [facility], [address], [place], [place] [address]    Report Status PENDING  Incomplete     Time coordinating discharge: 35 minutes  SIGNED:   Glade Lloyd, MD  [facility] 01/21/2018, 11:11 AM Pager: [phone]  If 7PM-7AM, please contact night-coverage [email or web address] Password [email or web address]

## 2018-01-21 NOTE — Progress Notes (Signed)
Patient ID: Michael [name], male   DOB: [birth year], 57 y.o.   MRN: [id number] I was notified that the patient does not have a bed at shelter till tomorrow.  We will order the discharge.  Follow-up with social worker regarding discharge plan for tomorrow to to shelter.

## 2018-01-21 NOTE — Progress Notes (Signed)
[facility]:  [phone] General Surgery Progress Note   LOS: 8 days  POD -     Chief Complaint: Ear infection  Assessment and Plan: 1.  Infected cyst of left ear  I&D - 01/20/2018 - [place]  Wound is clean.  He may wash/shower the wound.  Okay to discharge.  Follow up with our office as needed.  2. History of EtOH abuse 3. DM 4.  Seizure   Principal Problem:   Alcohol use disorder, severe, dependence (HCC) Active Problems:   Acute respiratory failure with hypoxia and hypercapnia (HCC)   Pressure injury of skin   Subjective:  Doing okay.  No complaints with ear.  Objective:   Vitals:   01/20/18 1944 01/21/18 0531  BP: 130/70 136/73  Pulse: 75 79  Resp: 18 16  Temp: 97.9 F (36.6 C) 98.2 F (36.8 C)  SpO2: 98% 97%     Intake/Output from previous day:  04/27 0701 - 04/28 0700 In: 1440 [P.O.:1440] Out: -   Intake/Output this shift:  No intake/output data recorded.   Physical Exam:   General: WN AA M who is alert and oriented.    HEENT: Left ear wound clean.  May shower.   Lab Results:    Recent Labs    01/19/18 0541 01/20/18 0757  WBC 5.5 5.3  HGB 11.0* 11.0*  HCT 31.7* 31.6*  PLT 257 318    BMET   Recent Labs    01/19/18 0541 01/20/18 0757  NA 139 135  K 4.0 4.6  CL 107 107  CO2 22 21*  GLUCOSE 174* 270*  BUN 12 17  CREATININE 0.78 0.91  CALCIUM 9.4 9.3    PT/INR  No results for input(s): LABPROT, INR in the last 72 hours.  ABG  No results for input(s): PHART, HCO3 in the last 72 hours.  Invalid input(s): PCO2, PO2   Studies/Results:  No results found.   Anti-infectives:   Anti-infectives (From admission, onward)   Start     Dose/Rate Route Frequency Ordered Stop   01/17/18 1000  doxycycline (VIBRA-TABS) tablet 100 mg     100 mg Oral Every 12 hours 01/17/18 0912     01/16/18 2200  amoxicillin-clavulanate (AUGMENTIN) 875-125 MG per tablet 1 tablet  Status:  Discontinued     1 tablet Oral Every 12 hours  01/16/18 1833 01/20/18 1144   01/13/18 2200  piperacillin-tazobactam (ZOSYN) IVPB 3.375 g  Status:  Discontinued     3.375 g 12.5 mL/hr over 240 Minutes Intravenous Every 8 hours 01/13/18 2109 01/16/18 1831   01/13/18 1845  cefTRIAXone (ROCEPHIN) 1 g in sodium chloride 0.9 % 100 mL IVPB     1 g 200 mL/hr over 30 Minutes Intravenous  Once 01/13/18 1839 01/13/18 1945   01/13/18 1845  azithromycin (ZITHROMAX) 500 mg in sodium chloride 0.9 % 250 mL IVPB     500 mg 250 mL/hr over 60 Minutes Intravenous  Once 01/13/18 1839 01/13/18 2005      Ovidio Kin, MD, FACS Pager: [phone] [facility]: [phone] 01/21/2018

## 2018-01-21 NOTE — Progress Notes (Signed)
Writer alerted MD that pt cannot get a bed at [facility] til tomorrow. Pt will get medications thru staff at [facility]. Pt will also be able to keep left ear incision site clean and with dry dressing at [facility]. Otherwise, pt states if he cannot get to [facility] then he will "sleep on park bench".

## 2018-01-21 NOTE — Progress Notes (Addendum)
CSW met with patient to discuss discharge plan. CSW discussed with patient return to [facility], and patient indicated that he will not be able to go until tomorrow. CSW attempted to call [facility], and was unable to get anyone on the phone as administration offices are closed over the weekend. Per previous CSW note, patient was already set up to admit to [facility], but patient states he has been there before and knows that he will not be able to get in over the weekend. Patient stated he will still leave the hospital today and can find a park bench to sleep on until he can get into [employer].  CSW discussed with RN, who is concerned about patient's ability to keep his wound clean if he's sleeping on a park bench. RN to follow up with MD about concerns.   CSW to continue to follow.  Laveda Abbe, LCSW Clinical Social Worker [phone] (Weekend coverage)

## 2018-01-21 NOTE — Progress Notes (Signed)
Writer alerted MD to [facility] at this time 414. Covered pt with 9 units Novolog. Pt without distress.

## 2018-01-22 LAB — GLUCOSE, CAPILLARY: Glucose-Capillary: 176 mg/dL — ABNORMAL HIGH (ref 65–99)

## 2018-01-22 NOTE — Progress Notes (Signed)
Patient is stating that he is missing his wallet and pocket knife. RN checked [address] east belonging area, security office and checked with the ICU secretary, there are no other belongings anywhere in the hospital except what he was given. Patient notified.

## 2018-01-22 NOTE — Progress Notes (Signed)
LCSW confirmed [facility] bed [facility] and provided patient with bus pass to transport.   Beulah Gandy [place] CSW [phone]

## 2018-01-22 NOTE — Progress Notes (Signed)
Patient ID: Michael [name], male   DOB: 03-31-1961, 57 y.o.   MRN: [id number]  Patient will be discharged to the shelter today as there was no bed available on 01/21/2018.  He remains hemodynamically stable and stable for discharge.  Please refer the full discharge summary done by me on 01/21/2018 for full details.

## 2018-01-24 LAB — AEROBIC/ANAEROBIC CULTURE W GRAM STAIN (SURGICAL/DEEP WOUND): Culture: NORMAL

## 2018-01-24 LAB — AEROBIC/ANAEROBIC CULTURE (SURGICAL/DEEP WOUND)

## 2018-08-30 LAB — BLOOD GAS, VENOUS
Acid-base deficit: 6.1 mmol/L — ABNORMAL HIGH (ref 0.0–2.0)
Bicarbonate: 18.6 mmol/L — ABNORMAL LOW (ref 20.0–28.0)
FIO2: 21
O2 Saturation: 85.8 %
Patient temperature: 98.6
pCO2, Ven: 35.8 mmHg — ABNORMAL LOW (ref 44.0–60.0)
pH, Ven: 7.336 (ref 7.250–7.430)
pO2, Ven: 55.6 mmHg — ABNORMAL HIGH (ref 32.0–45.0)

## 2020-02-13 IMAGING — CR DG KNEE COMPLETE 4+V*L*
4 series · 4 of 4 positions shown · non-contrast
Comparison: None.

CLINICAL DATA: Left knee pain.

EXAM:
LEFT KNEE - COMPLETE 4+ VIEW

[x knee ap left (1 of 3)]
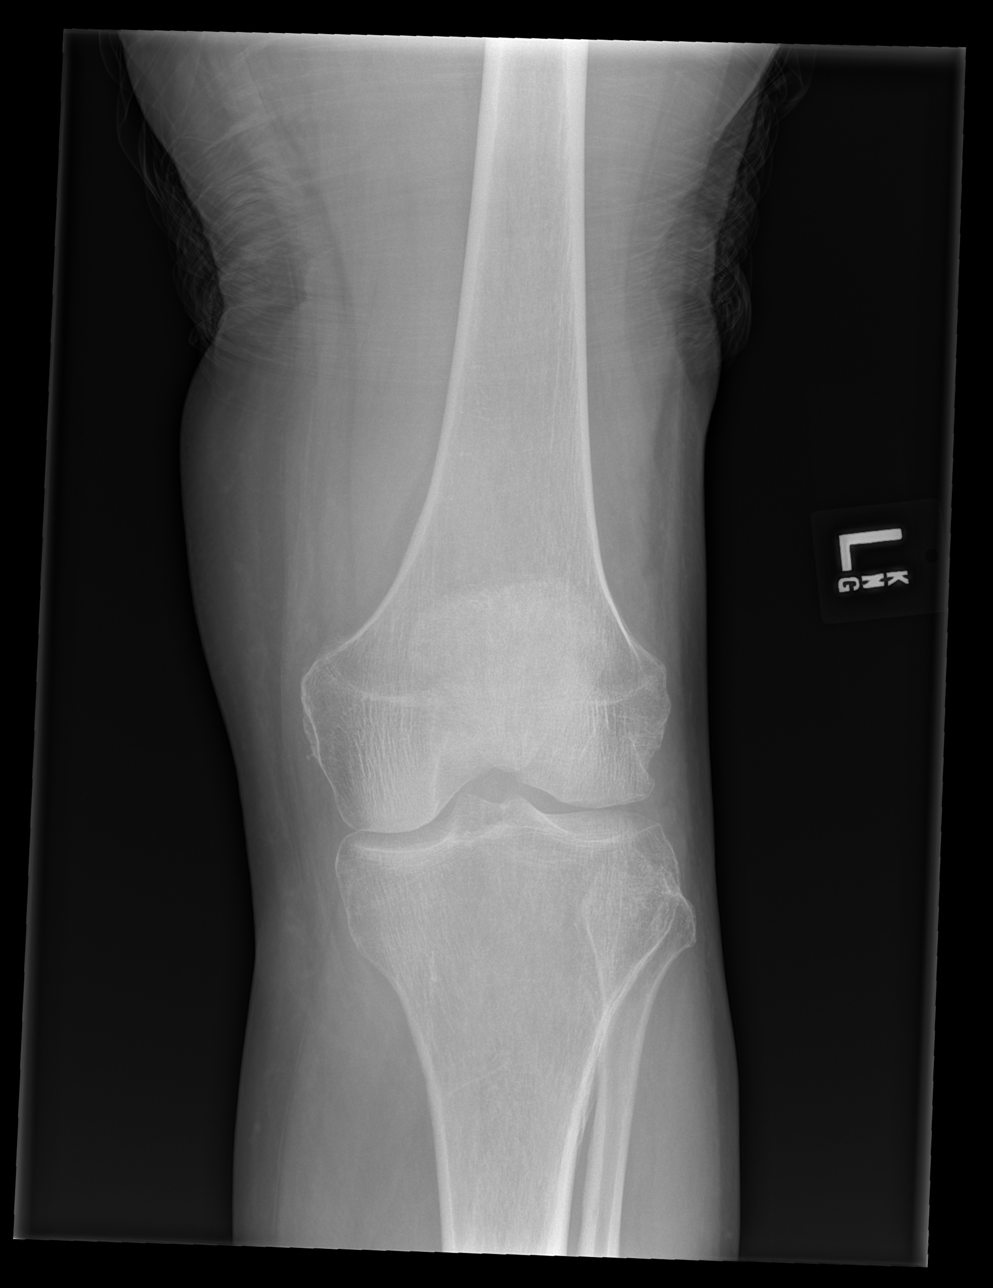

[x knee ap left (2 of 3)]
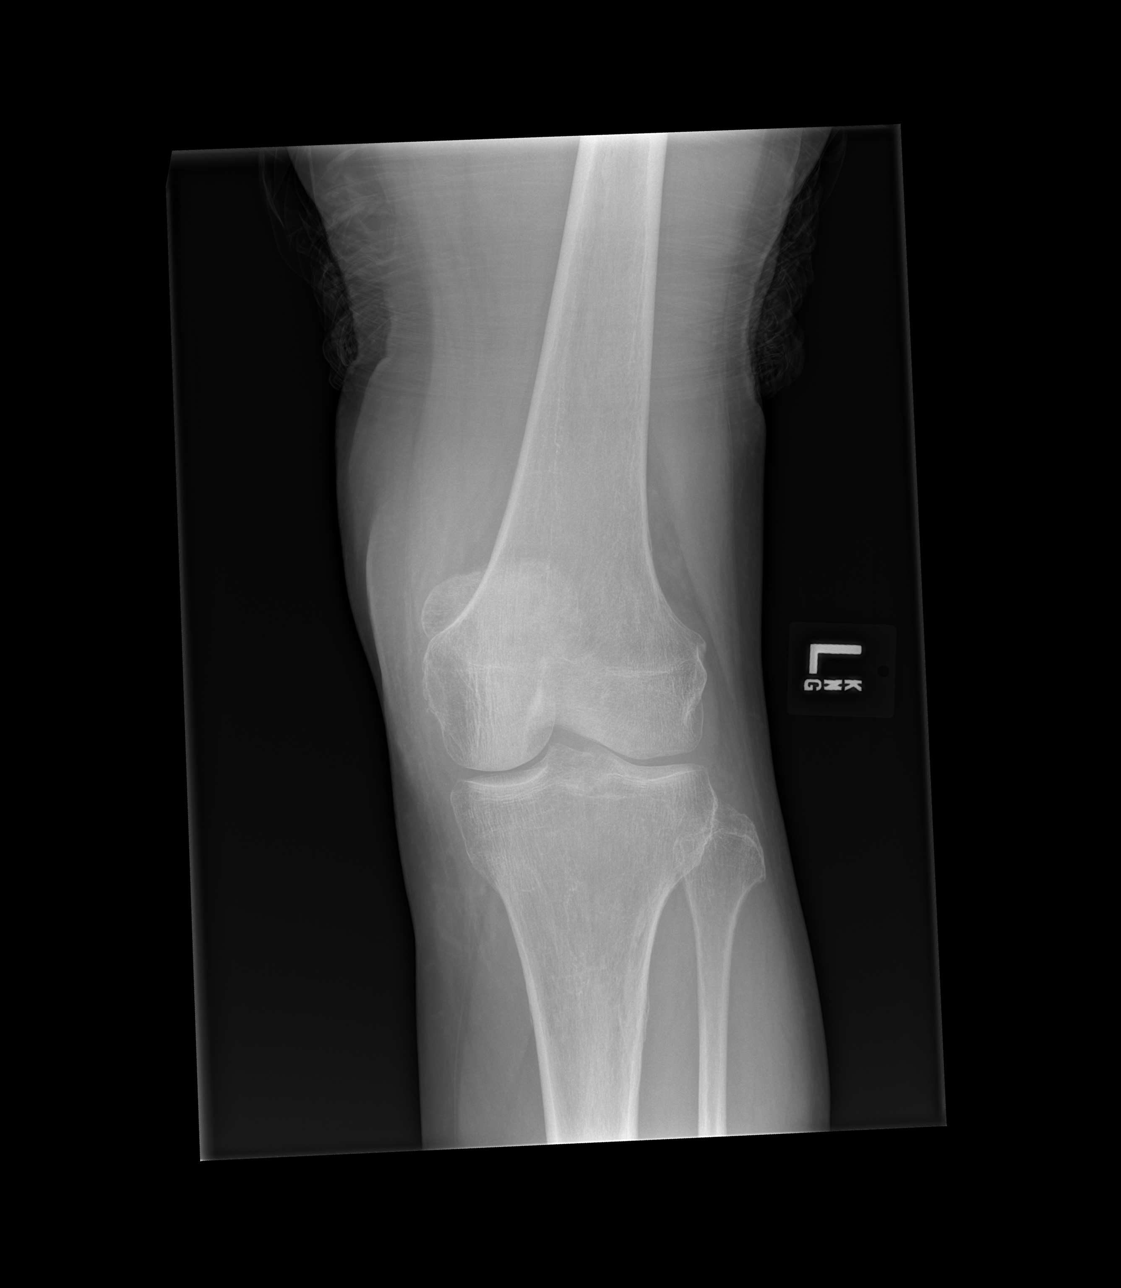

[x knee ap left (3 of 3)]
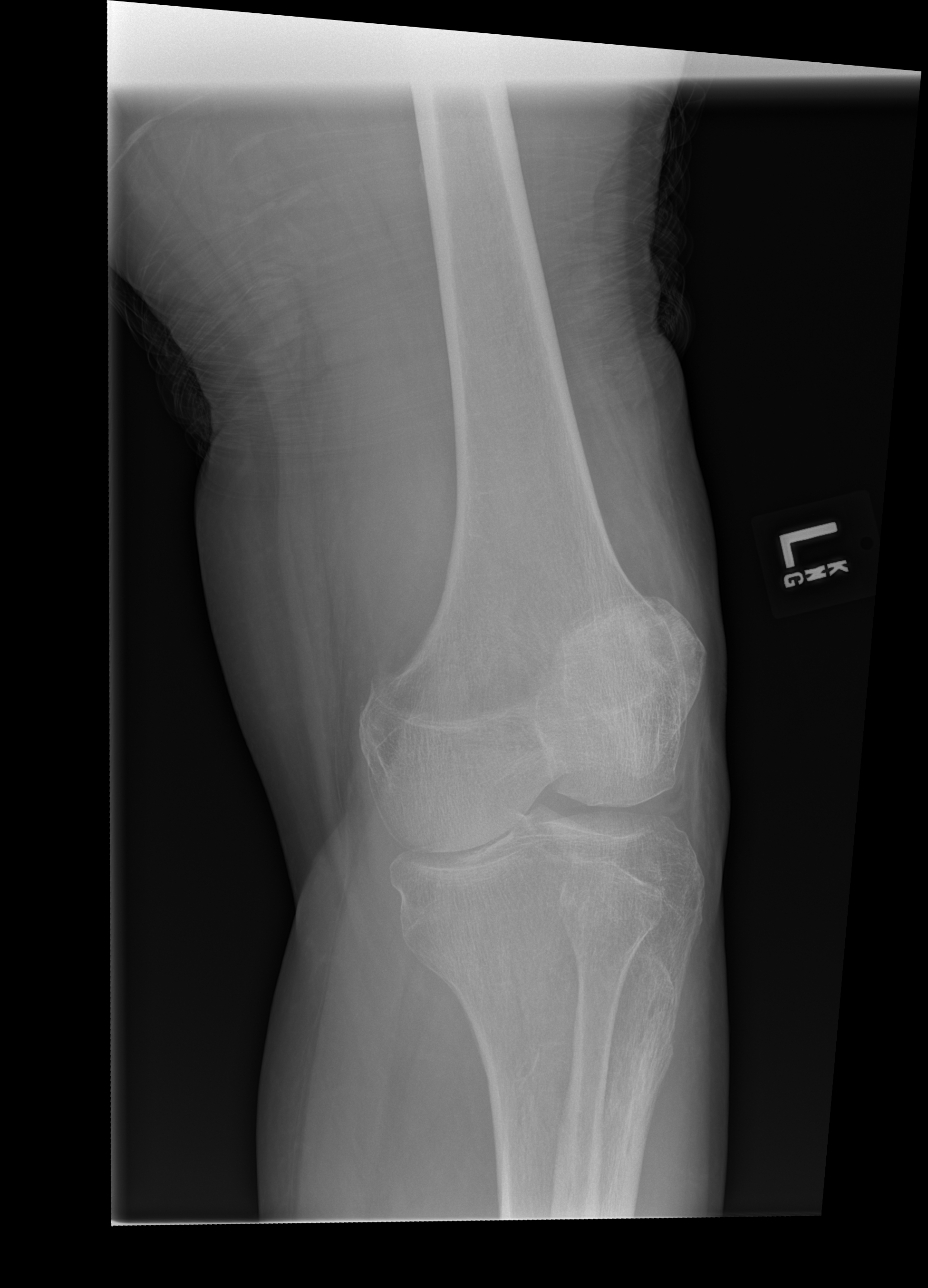

[x knee lat left]
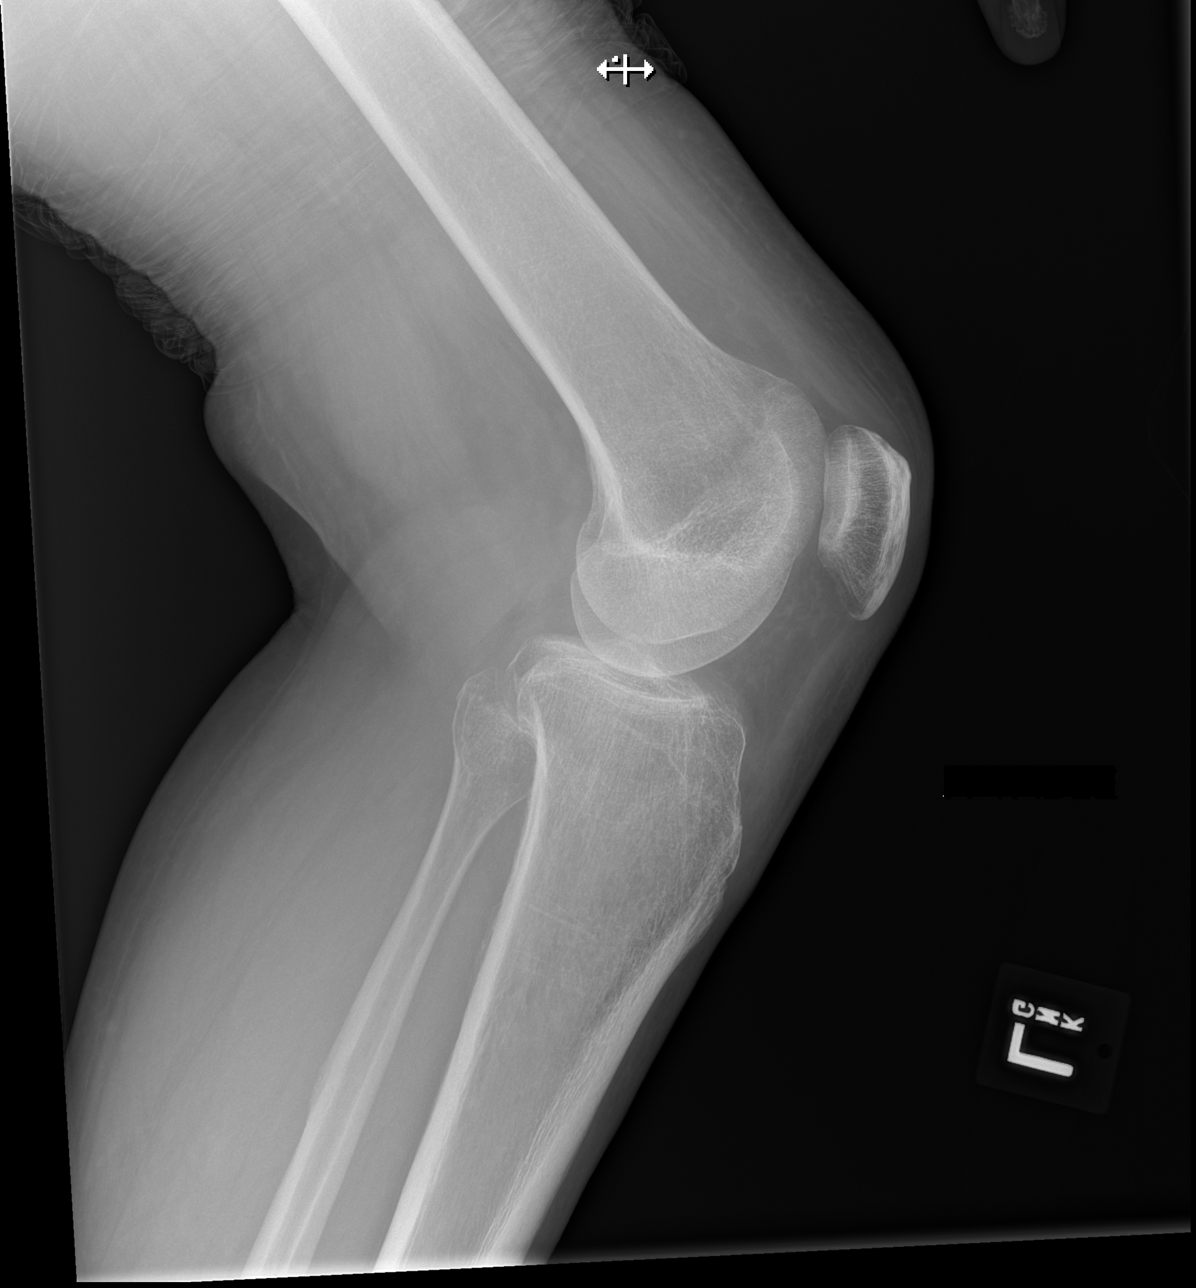

[4 of 4 positions shown; findings below may reference images not displayed]

FINDINGS: No evidence of fracture, dislocation, or joint effusion. Mild medial
tibiofemoral joint space narrowing. Alignment is otherwise
maintained. Soft tissues are unremarkable.
IMPRESSION: 1. No acute abnormality.
2. Mild medial tibiofemoral joint space narrowing, typically
degenerative.
